# Patient Record
Sex: Male | Born: 1975
Health system: Southern US, Community
[De-identification: ages and names within clinical notes are randomized; demographics above are authoritative.]

## PROBLEM LIST (undated history)

## (undated) DIAGNOSIS — F1010 Alcohol abuse, uncomplicated: Secondary | ICD-10-CM

## (undated) DIAGNOSIS — K859 Acute pancreatitis without necrosis or infection, unspecified: Secondary | ICD-10-CM

---

## 2016-11-13 ENCOUNTER — Emergency Department (HOSPITAL_COMMUNITY): Payer: No Typology Code available for payment source

## 2016-11-13 ENCOUNTER — Emergency Department (HOSPITAL_COMMUNITY)
Admission: EM | Admit: 2016-11-13 | Discharge: 2016-11-13 | Disposition: A | Discharge status: Home / Self Care | Payer: No Typology Code available for payment source | Attending: Emergency Medicine | Admitting: Emergency Medicine

## 2016-11-13 ENCOUNTER — Encounter (HOSPITAL_COMMUNITY): Payer: Self-pay | Admitting: Emergency Medicine

## 2016-11-13 DIAGNOSIS — Q631 Lobulated, fused and horseshoe kidney: Secondary | ICD-10-CM | POA: Insufficient documentation

## 2016-11-13 DIAGNOSIS — F17210 Nicotine dependence, cigarettes, uncomplicated: Secondary | ICD-10-CM | POA: Diagnosis not present

## 2016-11-13 DIAGNOSIS — R1013 Epigastric pain: Secondary | ICD-10-CM

## 2016-11-13 DIAGNOSIS — R1010 Upper abdominal pain, unspecified: Secondary | ICD-10-CM | POA: Diagnosis present

## 2016-11-13 LAB — URINALYSIS, ROUTINE W REFLEX MICROSCOPIC
BILIRUBIN URINE: NEGATIVE
GLUCOSE, UA: NEGATIVE mg/dL
Hgb urine dipstick: NEGATIVE
KETONES UR: 80 mg/dL — AB
LEUKOCYTES UA: NEGATIVE
Nitrite: NEGATIVE
Protein, ur: NEGATIVE mg/dL
Specific Gravity, Urine: 1.03 (ref 1.005–1.030)
pH: 5 (ref 5.0–8.0)

## 2016-11-13 LAB — COMPREHENSIVE METABOLIC PANEL
ALT: 22 U/L (ref 17–63)
AST: 25 U/L (ref 15–41)
Albumin: 4.9 g/dL (ref 3.5–5.0)
Alkaline Phosphatase: 74 U/L (ref 38–126)
Anion gap: 11 (ref 5–15)
BILIRUBIN TOTAL: 1.5 mg/dL — AB (ref 0.3–1.2)
BUN: 18 mg/dL (ref 6–20)
CHLORIDE: 102 mmol/L (ref 101–111)
CO2: 25 mmol/L (ref 22–32)
CREATININE: 0.94 mg/dL (ref 0.61–1.24)
Calcium: 9.4 mg/dL (ref 8.9–10.3)
GFR calc Af Amer: >60 mL/min (ref >60)
Glucose, Bld: 114 mg/dL — ABNORMAL HIGH (ref 65–99)
Potassium: 3.4 mmol/L — ABNORMAL LOW (ref 3.5–5.1)
Sodium: 138 mmol/L (ref 135–145)
TOTAL PROTEIN: 7.8 g/dL (ref 6.5–8.1)

## 2016-11-13 LAB — CBC
HCT: 44.3 % (ref 39.0–52.0)
Hemoglobin: 15.6 g/dL (ref 13.0–17.0)
MCH: 32.7 pg (ref 26.0–34.0)
MCHC: 35.2 g/dL (ref 30.0–36.0)
MCV: 92.9 fL (ref 78.0–100.0)
PLATELETS: 188 10*3/uL (ref 150–400)
RBC: 4.77 MIL/uL (ref 4.22–5.81)
RDW: 12.1 % (ref 11.5–15.5)
WBC: 5.4 10*3/uL (ref 4.0–10.5)

## 2016-11-13 LAB — LIPASE, BLOOD: Lipase: 17 U/L (ref 11–51)

## 2016-11-13 MED ORDER — IOPAMIDOL (ISOVUE-300) INJECTION 61%
100.0000 mL | Freq: Once | INTRAVENOUS | Status: AC | PRN
  Administered 2016-11-13: 100 mL via INTRAVENOUS

## 2016-11-13 MED ORDER — SUCRALFATE 1 G PO TABS: 1.0000 g | ORAL_TABLET | Freq: Three times a day (TID) | ORAL | 0 refills | Status: DC

## 2016-11-13 MED ORDER — ONDANSETRON HCL 4 MG/2ML IJ SOLN
4.0000 mg | Freq: Once | INTRAMUSCULAR | Status: AC
  Administered 2016-11-13: 4 mg via INTRAVENOUS
  Filled 2016-11-13: qty 2

## 2016-11-13 MED ORDER — OXYCODONE-ACETAMINOPHEN 5-325 MG PO TABS: 1.0000 | ORAL_TABLET | Freq: Four times a day (QID) | ORAL | 0 refills | Status: DC | PRN

## 2016-11-13 MED ORDER — OMEPRAZOLE 20 MG PO CPDR: 20.0000 mg | DELAYED_RELEASE_CAPSULE | Freq: Every day | ORAL | 0 refills | Status: DC

## 2016-11-13 MED ORDER — HYDROMORPHONE HCL 1 MG/ML IJ SOLN
1.0000 mg | Freq: Once | INTRAMUSCULAR | Status: AC
  Administered 2016-11-13: 1 mg via INTRAVENOUS
  Filled 2016-11-13: qty 1

## 2016-11-13 MED ORDER — IOPAMIDOL (ISOVUE-300) INJECTION 61%
INTRAVENOUS | Status: AC
  Filled 2016-11-13: qty 100

## 2016-11-13 MED ORDER — SODIUM CHLORIDE 0.9 % IV BOLUS (SEPSIS)
1000.0000 mL | Freq: Once | INTRAVENOUS | Status: AC
  Administered 2016-11-13: 1000 mL via INTRAVENOUS

## 2016-11-13 MED ORDER — FAMOTIDINE 20 MG PO TABS: 20.0000 mg | ORAL_TABLET | Freq: Two times a day (BID) | ORAL | 0 refills | Status: DC

## 2016-11-13 NOTE — ED Provider Notes (Signed)
[facility]-EMERGENCY DEPT Provider Note   CSN: [id number] Arrival date & time: 11/13/16  1443     History   Chief Complaint Chief Complaint  Patient presents with  . Abdominal Pain  . Nausea    HPI Stephen Bender is a 41 y.o. male.  Patient with no past surgical history, 20+ year alcohol history, drinks approximately a pint of liquor per day -- presents with onset of upper abdominal pain starting last night. Patient has had associated nausea but no vomiting. No fevers, chest pain, shortness of breath. No diarrhea or urinary symptoms including hematuria. Pain radiates into his mid back at times. Patient has not eaten today because the pain has been severe and constant. He had difficulty sleeping because of the pain last night. His never had symptoms like this before except for a short lived mild episode earlier in the week. Onset of symptoms acute. Course is gradually worsening. Nothing makes symptoms better or worse. Patient takes NSAIDs on occasion, not everyday.       History reviewed. No pertinent past medical history.  There are no active problems to display for this patient.   History reviewed. No pertinent surgical history.     Home Medications    Prior to Admission medications   Not on File    Family History No family history on file.  Social History Social History  Substance Use Topics  . Smoking status: Current Every Day Smoker    Packs/day: 1.00    Types: Cigarettes  . Smokeless tobacco: Not on file  . Alcohol use Yes     Allergies   Patient has no known allergies.   Review of Systems Review of Systems  Constitutional: Negative for fever.  HENT: Negative for rhinorrhea and sore throat.   Eyes: Negative for redness.  Respiratory: Negative for cough.   Cardiovascular: Negative for chest pain.  Gastrointestinal: Positive for abdominal pain and nausea. Negative for diarrhea and vomiting.  Genitourinary: Negative for dysuria.  Musculoskeletal: Positive  for back pain. Negative for myalgias.  Skin: Negative for rash.  Neurological: Negative for headaches.     Physical Exam Updated Vital Signs BP (!) 151/98 (BP Location: Right Arm)   Pulse 60   Temp 97.9 F (36.6 C) (Oral)   Resp 20   Wt 79.4 kg   SpO2 100%   Physical Exam  Constitutional: He appears well-developed and well-nourished. He appears distressed (Uncomfortable appearing).  HENT:  Head: Normocephalic and atraumatic.  Eyes: Conjunctivae are normal. Right eye exhibits no discharge. Left eye exhibits no discharge.  Neck: Normal range of motion. Neck supple.  Cardiovascular: Normal rate, regular rhythm and normal heart sounds.   Pulmonary/Chest: Effort normal and breath sounds normal. No respiratory distress. He has no wheezes. He has no rales.  Abdominal: Soft. He exhibits no mass. There is tenderness. There is guarding. There is no rebound, no tenderness at McBurney's point and negative Murphy's sign.    Neurological: He is alert.  Skin: Skin is warm and dry.  Psychiatric: He has a normal mood and affect.  Nursing note and vitals reviewed.    ED Treatments / Results  Labs (all labs ordered are listed, but only abnormal results are displayed) Labs Reviewed  COMPREHENSIVE METABOLIC PANEL - Abnormal; Notable for the following:       Result Value   Potassium 3.4 (*)    Glucose, Bld 114 (*)    Total Bilirubin 1.5 (*)    All other components within normal limits  URINALYSIS,  ROUTINE W REFLEX MICROSCOPIC - Abnormal; Notable for the following:    Ketones, ur 80 (*)    All other components within normal limits  LIPASE, BLOOD  CBC    EKG  EKG Interpretation None       Radiology Ct Abdomen Pelvis W Contrast  Result Date: 11/13/2016 CLINICAL DATA:  Abdominal pain with nausea for 24 hours. EXAM: CT ABDOMEN AND PELVIS WITH CONTRAST TECHNIQUE: Multidetector CT imaging of the abdomen and pelvis was performed using the standard protocol following bolus  administration of intravenous contrast. CONTRAST:  ISOVUE-300 IOPAMIDOL (ISOVUE-300) INJECTION 61% COMPARISON:  None. FINDINGS: Lower chest: No acute abnormality. Hepatobiliary: No focal liver abnormality is seen. No gallstones, gallbladder wall thickening, or biliary dilatation. Pancreas: Unremarkable. No pancreatic ductal dilatation or surrounding inflammatory changes. Spleen: Normal in size without focal abnormality. Adrenals/Urinary Tract: There is a horseshoe kidney. No renal masses or stones. No hydronephrosis. Ureters are normal in course and in caliber. Bladder is unremarkable. Stomach/Bowel: Stomach is within normal limits. Appendix appears normal. No evidence of bowel wall thickening, distention, or inflammatory changes. Vascular/Lymphatic: No significant vascular findings are present. No enlarged abdominal or pelvic lymph nodes. Reproductive: Prostate is unremarkable. Other: No abdominal wall hernia or abnormality. No abdominopelvic ascites. Musculoskeletal: No acute or significant osseous findings. IMPRESSION: 1. No acute findings within the abdomen or pelvis. 2. Horseshoe kidney. Electronically Signed   By: Amie Portland M.D.   On: 11/13/2016 19:05    Procedures Procedures (including critical care time)  Medications Ordered in ED Medications  iopamidol (ISOVUE-300) 61 % injection (not administered)  sodium chloride 0.9 % bolus 1,000 mL (1,000 mLs Intravenous New Bag/Given 11/13/16 1824)  HYDROmorphone (DILAUDID) injection 1 mg (1 mg Intravenous Given 11/13/16 1824)  ondansetron (ZOFRAN) injection 4 mg (4 mg Intravenous Given 11/13/16 1823)  iopamidol (ISOVUE-300) 61 % injection 100 mL (100 mLs Intravenous Contrast Given 11/13/16 1845)     Initial Impression / Assessment and Plan / ED Course  I have reviewed the triage vital signs and the nursing notes.  Pertinent labs & imaging results that were available during my care of the patient were reviewed by me and considered in my medical  decision making (see chart for details).     Patient seen and examined. Labs reviewed and are reassuring however patient appears uncomfortable. No previous imaging. CT ordered given due to exam. Medications ordered.   Vital signs reviewed and are as follows: BP (!) 151/98 (BP Location: Right Arm)   Pulse 60   Temp 97.9 F (36.6 C) (Oral)   Resp 20   Wt 79.4 kg   SpO2 100%   8:29 PM Patient's symptoms resolved with treatment. Informed of neg CT result. PO challenged without pain or vomiting. Will discharge to home with #5 Percocet, omeprazole, Pepcid, Carafate. GI and PCP referrals given. Patient informed of incidental horseshoe kidney finding on CT scan. He has no urinary problems or history of such.   The patient was urged to return to the Emergency Department immediately with worsening of current symptoms, worsening abdominal pain, persistent vomiting, blood noted in stools, fever, or any other concerns. The patient verbalized understanding.    Final Clinical Impressions(s) / ED Diagnoses   Final diagnoses:  Epigastric pain  Horseshoe kidney   Patient with Epigastric abdominal pain. Vitals are stable, no fever. Labs reassuring. Imaging incidental horseshoe kidney however no other concerning findings. Ordered due to pain out of proportion to exam. No signs of dehydration, patient is tolerating PO's.  Lungs are clear and no signs suggestive of PNA. Low concern for appendicitis, cholecystitis, pancreatitis, ruptured viscus, UTI, kidney stone, aortic dissection, aortic aneurysm or other emergent abdominal etiology. Supportive therapy indicated with return if symptoms worsen.     New Prescriptions Discharge Medication List as of 11/13/2016  8:22 PM    START taking these medications   Details  famotidine (PEPCID) 20 MG tablet Take 1 tablet (20 mg total) by mouth 2 (two) times daily., Starting Fri 11/13/2016, Print    omeprazole (PRILOSEC) 20 MG capsule Take 1 capsule (20 mg total) by  mouth daily., Starting Fri 11/13/2016, Print    oxyCODONE-acetaminophen (PERCOCET/ROXICET) 5-325 MG tablet Take 1-2 tablets by mouth every 6 (six) hours as needed for severe pain., Starting Fri 11/13/2016, Print    sucralfate (CARAFATE) 1 g tablet Take 1 tablet (1 g total) by mouth 4 (four) times daily -  with meals and at bedtime., Starting Fri 11/13/2016, Print         Easten Maceachern, PA-C 11/13/16 2039    Derwood Kaplan, MD 11/15/16 1233

## 2016-11-13 NOTE — Discharge Instructions (Signed)
Please read and follow all provided instructions.  Your diagnoses today include:  1. Epigastric pain     Tests performed today include:  Blood counts and electrolytes  Blood tests to check liver and kidney function  Blood tests to check pancreas function  Urine test to look for infection and pregnancy (in women)  CT scan - shows horseshoe kidney, no other problems  Vital signs. See below for your results today.   Medications prescribed:   Omeprazole (Prilosec) - stomach acid reducer  This medication can be found over-the-counter   Pepcid (famotidine) - antihistamine  You can find this medication over-the-counter.   DO NOT exceed:    Pepcid every 12 hours   Carafate - for stomach upset and to protect your stomach   Percocet (oxycodone/acetaminophen) - narcotic pain medication  DO NOT drive or perform any activities that require you to be awake and alert because this medicine can make you drowsy. BE VERY CAREFUL not to take multiple medicines containing Tylenol (also called acetaminophen). Doing so can lead to an overdose which can damage your liver and cause liver failure and possibly death. Take any prescribed medications only as directed.  Home care instructions:   Follow any educational materials contained in this packet.  Follow-up instructions: Please follow-up with your primary care provider in the next 7 days for further evaluation of your symptoms.    Return instructions:  SEEK IMMEDIATE MEDICAL ATTENTION IF:  The pain does not go away or becomes severe   A temperature above 101F develops   Repeated vomiting occurs (multiple episodes)   The pain becomes localized to portions of the abdomen. The right side could possibly be appendicitis. In an adult, the left lower portion of the abdomen could be colitis or diverticulitis.   Blood is being passed in stools or vomit (bright red or black tarry stools)   You develop chest pain, difficulty  breathing, dizziness or fainting, or become confused, poorly responsive, or inconsolable (young children)  If you have any other emergent concerns regarding your health  Additional Information: Abdominal (belly) pain can be caused by many things. Your caregiver performed an examination and possibly ordered blood/urine tests and imaging (CT scan, x-rays, ultrasound). Many cases can be observed and treated at home after initial evaluation in the emergency department. Even though you are being discharged home, abdominal pain can be unpredictable. Therefore, you need a repeated exam if your pain does not resolve, returns, or worsens. Most patients with abdominal pain don't have to be admitted to the hospital or have surgery, but serious problems like appendicitis and gallbladder attacks can start out as nonspecific pain. Many abdominal conditions cannot be diagnosed in one visit, so follow-up evaluations are very important.  Your vital signs today were: BP 139/88 (BP Location: Left Arm)    Pulse 71    Temp 97.8 F (36.6 C) (Oral)    Resp 16    Wt 79.4 kg    SpO2 100%  If your blood pressure (bp) was elevated above 135/85 this visit, please have this repeated by your doctor within one month. --------------

## 2016-11-13 NOTE — ED Triage Notes (Signed)
Pt complaint of generalized abdominal pain with associated nausea post eating chicken last night; believes he was constipated but pain as worsened since.

## 2018-09-19 ENCOUNTER — Other Ambulatory Visit: Payer: Self-pay

## 2018-09-19 ENCOUNTER — Encounter (HOSPITAL_BASED_OUTPATIENT_CLINIC_OR_DEPARTMENT_OTHER): Payer: Self-pay

## 2018-09-19 ENCOUNTER — Emergency Department (HOSPITAL_BASED_OUTPATIENT_CLINIC_OR_DEPARTMENT_OTHER)
Admission: EM | Admit: 2018-09-19 | Discharge: 2018-09-19 | Disposition: A | Discharge status: Home / Self Care | Payer: No Typology Code available for payment source | Attending: Emergency Medicine | Admitting: Emergency Medicine

## 2018-09-19 ENCOUNTER — Emergency Department (HOSPITAL_BASED_OUTPATIENT_CLINIC_OR_DEPARTMENT_OTHER): Payer: No Typology Code available for payment source

## 2018-09-19 DIAGNOSIS — Y929 Unspecified place or not applicable: Secondary | ICD-10-CM | POA: Insufficient documentation

## 2018-09-19 DIAGNOSIS — S92515A Nondisplaced fracture of proximal phalanx of left lesser toe(s), initial encounter for closed fracture: Secondary | ICD-10-CM | POA: Insufficient documentation

## 2018-09-19 DIAGNOSIS — Y9389 Activity, other specified: Secondary | ICD-10-CM | POA: Insufficient documentation

## 2018-09-19 DIAGNOSIS — S92532A Displaced fracture of distal phalanx of left lesser toe(s), initial encounter for closed fracture: Secondary | ICD-10-CM | POA: Insufficient documentation

## 2018-09-19 DIAGNOSIS — W228XXA Striking against or struck by other objects, initial encounter: Secondary | ICD-10-CM | POA: Insufficient documentation

## 2018-09-19 DIAGNOSIS — Z79899 Other long term (current) drug therapy: Secondary | ICD-10-CM | POA: Insufficient documentation

## 2018-09-19 DIAGNOSIS — F17210 Nicotine dependence, cigarettes, uncomplicated: Secondary | ICD-10-CM | POA: Insufficient documentation

## 2018-09-19 DIAGNOSIS — Y998 Other external cause status: Secondary | ICD-10-CM | POA: Insufficient documentation

## 2018-09-19 MED ORDER — IBUPROFEN 800 MG PO TABS: 800.0000 mg | ORAL_TABLET | Freq: Three times a day (TID) | ORAL | 0 refills | Status: DC | PRN

## 2018-09-19 MED FILL — IBUPROFEN 800 MG TAB: 800 | 10 days supply | Qty: 30 | Fill #0

## 2018-09-19 NOTE — ED Notes (Signed)
ED Provider at bedside. 

## 2018-09-19 NOTE — Discharge Instructions (Addendum)
It was my pleasure taking care of you today!   Ibuprofen as needed for pain. Ice and elevate for additional pain relief.   Follow up with your doctor if your symptoms persist.   Return to ER for new or worsening symptoms, any additional concerns.

## 2018-09-19 NOTE — ED Provider Notes (Signed)
[facility] EMERGENCY DEPARTMENT Provider Note   CSN: [id number] Arrival date & time: 09/19/18  1117     History   Chief Complaint Chief Complaint  Patient presents with  . Foot Injury    HPI Stephen Bender is a 43 y.o. male.  The history is provided by the patient and medical records. No language interpreter was used.  Foot Injury   Stephen Bender is a 43 y.o. male who presents to the Emergency Department complaining of left little toe pain.  He actually stumped his toe on furniture about a week ago.  It was sore, but he was able to continue working and did not think much about it.  He was at work today when a piece of lumber fell on his foot and gave him an acute worsening of his pain.  He denies any numbness or tingling.  No weakness.  Able to ambulate on the injury.  History reviewed. No pertinent past medical history.  There are no active problems to display for this patient.   History reviewed. No pertinent surgical history.      Home Medications    Prior to Admission medications   Medication Sig Start Date End Date Taking? Authorizing Provider  bismuth subsalicylate (PEPTO BISMOL) 262 MG chewable tablet Chew 524 mg by mouth as needed for indigestion.    [provider]  famotidine (PEPCID) 20 MG tablet Take 1 tablet (20 mg total) by mouth 2 (two) times daily. 11/13/16   Renne Crigler, PA-C  ibuprofen (ADVIL,MOTRIN) 800 MG tablet Take 1 tablet (800 mg total) by mouth every 8 (eight) hours as needed. 09/19/18   Selah Klang, Chase Picket, PA-C  omeprazole (PRILOSEC) 20 MG capsule Take 1 capsule (20 mg total) by mouth daily. 11/13/16   Renne Crigler, PA-C  oxyCODONE-acetaminophen (PERCOCET/ROXICET) 5-325 MG tablet Take 1-2 tablets by mouth every 6 (six) hours as needed for severe pain. 11/13/16   Renne Crigler, PA-C  sucralfate (CARAFATE) 1 g tablet Take 1 tablet (1 g total) by mouth 4 (four) times daily -  with meals and at bedtime. 11/13/16   Renne Crigler, PA-C      Family History No family history on file.  Social History Social History   Tobacco Use  . Smoking status: Current Every Day Smoker    Packs/day: 1.00    Types: Cigarettes  . Smokeless tobacco: Never Used  Substance Use Topics  . Alcohol use: Yes    Comment: occ  . Drug use: No     Allergies   Patient has no known allergies.   Review of Systems Review of Systems  Musculoskeletal: Positive for arthralgias and myalgias.  Skin: Negative for color change and wound.  Neurological: Negative for weakness and numbness.     Physical Exam Updated Vital Signs BP 129/87 (BP Location: Right Arm)   Pulse 89   Temp 97.7 F (36.5 C) (Oral)   Resp 18   Ht 6\' 4"  (1.93 m)   Wt 77.3 kg   SpO2 97%   BMI 20.73 kg/m   Physical Exam Vitals signs and nursing note reviewed.  Constitutional:      General: He is not in acute distress.    Appearance: He is well-developed.  HENT:     Head: Normocephalic and atraumatic.  Neck:     Musculoskeletal: Neck supple.  Cardiovascular:     Rate and Rhythm: Normal rate and regular rhythm.     Heart sounds: Normal heart sounds. No murmur.  Pulmonary:  Effort: Pulmonary effort is normal. No respiratory distress.     Breath sounds: Normal breath sounds. No wheezing or rales.  Musculoskeletal: Normal range of motion.     Comments: Tenderness and swelling to the left fifth toe.  No break of the skin.  No pain to the malleoli or forefoot.  Full range of motion.  2+ DP.  Sensation intact.  Skin:    General: Skin is warm and dry.  Neurological:     Mental Status: He is alert.      ED Treatments / Results  Labs (all labs ordered are listed, but only abnormal results are displayed) Labs Reviewed - No data to display  EKG None  Radiology Dg Foot Complete Left  Result Date: 09/19/2018 CLINICAL DATA:  Left foot pain centered about the little toe since a load of wood fell on the patient's foot 1 week ago. Initial encounter. EXAM: LEFT  FOOT - COMPLETE 3+ VIEW COMPARISON:  None. FINDINGS: The patient has a nondisplaced fracture of the neck of the proximal phalanx of the little toe. There is also a nondisplaced fracture of the distal phalanx of the little toe. Neither fracture disrupts an articular surface. No other acute bony or joint abnormality is identified. No evidence of arthropathy. IMPRESSION: Nondisplaced fractures of the neck of the proximal phalanx and the distal phalanx of the left little toe. Electronically Signed   By: Drusilla Kanner M.D.   On: 09/19/2018 12:26    Procedures Procedures (including critical care time)  Medications Ordered in ED Medications - No data to display   Initial Impression / Assessment and Plan / ED Course  I have reviewed the triage vital signs and the nursing notes.  Pertinent labs & imaging results that were available during my care of the patient were reviewed by me and considered in my medical decision making (see chart for details).     Stephen Bender is a 43 y.o. male who presents to ED for pain to the left fifth toe after dropping lumber on the foot earlier today.  Neurovascularly intact on exam. Does have tenderness and swelling to the toe. No open wound. X-ray shows nondisplaced fractures of the neck of the proximal phalanx and the distal phalanx.  Given postop shoe.  Rx for ibuprofen as needed.  PCP follow-up if symptoms are not improving.  Home care instructions discussed.  All questions answered.   Final Clinical Impressions(s) / ED Diagnoses   Final diagnoses:  Closed displaced fracture of distal phalanx of lesser toe of left foot, initial encounter  Closed nondisplaced fracture of proximal phalanx of lesser toe of left foot, initial encounter    ED Discharge Orders         Ordered    ibuprofen (ADVIL,MOTRIN) 800 MG tablet  Every 8 hours PRN     09/19/18 1256           Arin Peral, Chase Picket, PA-C 09/19/18 1305    Alvira Monday, MD 09/23/18 2224

## 2018-09-19 NOTE — ED Triage Notes (Signed)
Pt states he injured left 5th toe 1 week ago-load of wood fell on left foot at work today-bruising noted to left 2-3 toes-no break in skin-NAD-steady gait

## 2018-09-19 NOTE — ED Notes (Signed)
Pt verbalized understanding of discharge instructions and denies any further questions at this time.   

## 2018-10-13 ENCOUNTER — Emergency Department (HOSPITAL_COMMUNITY)
Admission: EM | Admit: 2018-10-13 | Discharge: 2018-10-13 | Disposition: A | Discharge status: Home / Self Care | Payer: Self-pay | Attending: Emergency Medicine | Admitting: Emergency Medicine

## 2018-10-13 ENCOUNTER — Other Ambulatory Visit: Payer: Self-pay

## 2018-10-13 ENCOUNTER — Encounter (HOSPITAL_COMMUNITY): Payer: Self-pay | Admitting: *Deleted

## 2018-10-13 DIAGNOSIS — F17210 Nicotine dependence, cigarettes, uncomplicated: Secondary | ICD-10-CM | POA: Insufficient documentation

## 2018-10-13 DIAGNOSIS — W01198A Fall on same level from slipping, tripping and stumbling with subsequent striking against other object, initial encounter: Secondary | ICD-10-CM | POA: Insufficient documentation

## 2018-10-13 DIAGNOSIS — Z79899 Other long term (current) drug therapy: Secondary | ICD-10-CM | POA: Insufficient documentation

## 2018-10-13 DIAGNOSIS — Y92015 Private garage of single-family (private) house as the place of occurrence of the external cause: Secondary | ICD-10-CM | POA: Insufficient documentation

## 2018-10-13 DIAGNOSIS — Y9389 Activity, other specified: Secondary | ICD-10-CM | POA: Insufficient documentation

## 2018-10-13 DIAGNOSIS — S0181XA Laceration without foreign body of other part of head, initial encounter: Secondary | ICD-10-CM | POA: Insufficient documentation

## 2018-10-13 DIAGNOSIS — Z23 Encounter for immunization: Secondary | ICD-10-CM | POA: Insufficient documentation

## 2018-10-13 DIAGNOSIS — Y999 Unspecified external cause status: Secondary | ICD-10-CM | POA: Insufficient documentation

## 2018-10-13 MED ORDER — LIDOCAINE-EPINEPHRINE (PF) 2 %-1:200000 IJ SOLN
20.0000 mL | Freq: Once | INTRAMUSCULAR | Status: AC
  Administered 2018-10-13: 20 mL via INTRADERMAL
  Filled 2018-10-13: qty 20

## 2018-10-13 MED ORDER — LIDOCAINE-EPINEPHRINE-TETRACAINE (LET) SOLUTION
3.0000 mL | Freq: Once | NASAL | Status: AC
  Administered 2018-10-13: 22:00:00 3 mL via TOPICAL
  Filled 2018-10-13: qty 3

## 2018-10-13 MED ORDER — IBUPROFEN 800 MG PO TABS: 800.0000 mg | ORAL_TABLET | Freq: Three times a day (TID) | ORAL | 0 refills | Status: DC | PRN

## 2018-10-13 MED ORDER — OXYCODONE-ACETAMINOPHEN 5-325 MG PO TABS
1.0000 | ORAL_TABLET | Freq: Once | ORAL | Status: AC
  Administered 2018-10-13: 1 via ORAL
  Filled 2018-10-13: qty 1

## 2018-10-13 MED ORDER — TETANUS-DIPHTH-ACELL PERTUSSIS 5-2.5-18.5 LF-MCG/0.5 IM SUSP
0.5000 mL | Freq: Once | INTRAMUSCULAR | Status: AC
  Administered 2018-10-13: 0.5 mL via INTRAMUSCULAR
  Filled 2018-10-13: qty 0.5

## 2018-10-13 NOTE — Discharge Instructions (Signed)
Take ibuprofen as needed for pain.  Monitor wound for any signs of infection.  Have your sutures remove in 5 days at the urgent care center.

## 2018-10-13 NOTE — ED Notes (Signed)
Bed: WTR8 Expected date:  Expected time:  Means of arrival:  Comments: 
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
## 2018-10-20 ENCOUNTER — Ambulatory Visit
Admission: EM | Admit: 2018-10-20 | Discharge: 2018-10-20 | Disposition: A | Discharge status: Home / Self Care | Payer: No Typology Code available for payment source

## 2018-10-20 DIAGNOSIS — Z4802 Encounter for removal of sutures: Secondary | ICD-10-CM

## 2018-10-20 NOTE — ED Notes (Signed)
After further assessment patient concerned, and this RN agrees, that uncertain if area is fully healed.  Asked APP to look at patient prior to suture removal.

## 2018-10-20 NOTE — ED Triage Notes (Signed)
Pt presents to [facility] for assessment of removal of 5 sutures from his chin, placed 7 days prior.

## 2018-10-20 NOTE — ED Notes (Signed)
Patient able to ambulate independently  

## 2018-10-20 NOTE — ED Notes (Signed)
After quick assessment by APP patient cleared for suture removal by this RN.  5 Sutures removed, return precautions reviewed.

## 2020-12-30 ENCOUNTER — Emergency Department (HOSPITAL_COMMUNITY): Payer: Self-pay

## 2020-12-30 ENCOUNTER — Encounter (HOSPITAL_COMMUNITY): Payer: Self-pay

## 2020-12-30 ENCOUNTER — Observation Stay (HOSPITAL_COMMUNITY)
Admission: EM | Admit: 2020-12-30 | Discharge: 2020-12-31 | Disposition: A | Discharge status: Home / Self Care | Payer: No Typology Code available for payment source | Attending: Internal Medicine | Admitting: Internal Medicine

## 2020-12-30 ENCOUNTER — Other Ambulatory Visit: Payer: Self-pay

## 2020-12-30 DIAGNOSIS — K859 Acute pancreatitis without necrosis or infection, unspecified: Principal | ICD-10-CM | POA: Insufficient documentation

## 2020-12-30 DIAGNOSIS — R1084 Generalized abdominal pain: Secondary | ICD-10-CM

## 2020-12-30 DIAGNOSIS — Z720 Tobacco use: Secondary | ICD-10-CM

## 2020-12-30 DIAGNOSIS — D696 Thrombocytopenia, unspecified: Secondary | ICD-10-CM | POA: Insufficient documentation

## 2020-12-30 DIAGNOSIS — Z20822 Contact with and (suspected) exposure to covid-19: Secondary | ICD-10-CM | POA: Insufficient documentation

## 2020-12-30 DIAGNOSIS — F1010 Alcohol abuse, uncomplicated: Secondary | ICD-10-CM

## 2020-12-30 DIAGNOSIS — F17210 Nicotine dependence, cigarettes, uncomplicated: Secondary | ICD-10-CM | POA: Insufficient documentation

## 2020-12-30 LAB — COMPREHENSIVE METABOLIC PANEL
ALT: 117 U/L — ABNORMAL HIGH (ref 0–44)
AST: 79 U/L — ABNORMAL HIGH (ref 15–41)
Albumin: 3.4 g/dL — ABNORMAL LOW (ref 3.5–5.0)
Alkaline Phosphatase: 78 U/L (ref 38–126)
Anion gap: 12 (ref 5–15)
BUN: 12 mg/dL (ref 6–20)
CO2: 25 mmol/L (ref 22–32)
Calcium: 9 mg/dL (ref 8.9–10.3)
Chloride: 97 mmol/L — ABNORMAL LOW (ref 98–111)
Creatinine, Ser: 0.93 mg/dL (ref 0.61–1.24)
GFR, Estimated: >60 mL/min (ref >60)
Glucose, Bld: 99 mg/dL (ref 70–99)
Potassium: 3.9 mmol/L (ref 3.5–5.1)
Sodium: 134 mmol/L — ABNORMAL LOW (ref 135–145)
Total Bilirubin: 1.8 mg/dL — ABNORMAL HIGH (ref 0.3–1.2)
Total Protein: 6.4 g/dL — ABNORMAL LOW (ref 6.5–8.1)

## 2020-12-30 LAB — CBC
HCT: 44.3 % (ref 39.0–52.0)
Hemoglobin: 16 g/dL (ref 13.0–17.0)
MCH: 35.9 pg — ABNORMAL HIGH (ref 26.0–34.0)
MCHC: 36.1 g/dL — ABNORMAL HIGH (ref 30.0–36.0)
MCV: 99.3 fL (ref 80.0–100.0)
Platelets: 127 10*3/uL — ABNORMAL LOW (ref 150–400)
RBC: 4.46 MIL/uL (ref 4.22–5.81)
RDW: 11.7 % (ref 11.5–15.5)
WBC: 6.3 10*3/uL (ref 4.0–10.5)
nRBC: 0 % (ref 0.0–0.2)

## 2020-12-30 LAB — URINALYSIS, ROUTINE W REFLEX MICROSCOPIC
Bilirubin Urine: NEGATIVE
Glucose, UA: NEGATIVE mg/dL
Hgb urine dipstick: NEGATIVE
Ketones, ur: 5 mg/dL — AB
Leukocytes,Ua: NEGATIVE
Nitrite: NEGATIVE
Protein, ur: NEGATIVE mg/dL
Specific Gravity, Urine: 1.017 (ref 1.005–1.030)
pH: 6 (ref 5.0–8.0)

## 2020-12-30 LAB — HEPATIC FUNCTION PANEL
ALT: 103 U/L — ABNORMAL HIGH (ref 0–44)
AST: 69 U/L — ABNORMAL HIGH (ref 15–41)
Albumin: 3.3 g/dL — ABNORMAL LOW (ref 3.5–5.0)
Alkaline Phosphatase: 75 U/L (ref 38–126)
Bilirubin, Direct: 0.7 mg/dL — ABNORMAL HIGH (ref 0.0–0.2)
Indirect Bilirubin: 1.5 mg/dL — ABNORMAL HIGH (ref 0.3–0.9)
Total Bilirubin: 2.2 mg/dL — ABNORMAL HIGH (ref 0.3–1.2)
Total Protein: 6.1 g/dL — ABNORMAL LOW (ref 6.5–8.1)

## 2020-12-30 LAB — LIPASE, BLOOD
Lipase: 73 U/L — ABNORMAL HIGH (ref 11–51)
Lipase: 57 U/L — ABNORMAL HIGH (ref 11–51)

## 2020-12-30 MED ORDER — DIPHENHYDRAMINE HCL 25 MG PO CAPS
25.0000 mg | ORAL_CAPSULE | Freq: Once | ORAL | Status: AC
  Administered 2020-12-30: 25 mg via ORAL
  Filled 2020-12-30: qty 1

## 2020-12-30 MED ORDER — SODIUM CHLORIDE 0.9 % IV BOLUS
1000.0000 mL | Freq: Once | INTRAVENOUS | Status: AC
  Administered 2020-12-30: 1000 mL via INTRAVENOUS

## 2020-12-30 MED ORDER — HYDROMORPHONE HCL 1 MG/ML IJ SOLN
0.5000 mg | Freq: Once | INTRAMUSCULAR | Status: AC
  Administered 2020-12-30: 0.5 mg via INTRAVENOUS
  Filled 2020-12-30: qty 1

## 2020-12-30 MED ORDER — GADOBUTROL 1 MMOL/ML IV SOLN
8.0000 mL | Freq: Once | INTRAVENOUS | Status: AC | PRN
  Administered 2020-12-30: 8 mL via INTRAVENOUS

## 2020-12-30 MED ORDER — HYDROMORPHONE HCL 1 MG/ML IJ SOLN
0.5000 mg | INTRAMUSCULAR | Status: AC | PRN
  Administered 2020-12-30 (×2): 0.5 mg via INTRAVENOUS
  Filled 2020-12-30 (×2): qty 1

## 2020-12-30 MED ORDER — HYDROMORPHONE HCL 1 MG/ML IJ SOLN
1.0000 mg | Freq: Once | INTRAMUSCULAR | Status: AC
  Administered 2020-12-30: 1 mg via INTRAVENOUS
  Filled 2020-12-30: qty 1

## 2020-12-30 MED ORDER — ONDANSETRON HCL 4 MG/2ML IJ SOLN
4.0000 mg | Freq: Once | INTRAMUSCULAR | Status: AC
  Administered 2020-12-30: 4 mg via INTRAVENOUS
  Filled 2020-12-30: qty 2

## 2020-12-30 NOTE — ED Provider Notes (Signed)
  Physical Exam  BP (!) 147/91 (BP Location: Right Arm)   Pulse (!) 58   Temp 98.4 F (36.9 C) (Oral)   Resp 18   Ht 6\' 4"  (1.93 m)   Wt 79.4 kg   SpO2 99%   BMI 21.30 kg/m   Physical Exam Vitals and nursing note reviewed.  Constitutional:      Appearance: Normal appearance.  HENT:     Head: Normocephalic and atraumatic.  Cardiovascular:     Rate and Rhythm: Normal rate.  Pulmonary:     Effort: Pulmonary effort is normal.  Musculoskeletal:     Cervical back: Normal range of motion.  Neurological:     Mental Status: He is alert.     ED Course/Procedures   Clinical Course as of 12/30/20 1537  Mon Dec 30, 2020  2092 45 year old male with abdominal pain onset yesterday morning, similar pain previously but never seen in the ER/worked up. Admits to alcohol use, no history of pancreatitis. No prior abdominal surgeries. Generalized tenderness on exam.  Initial labs obtained 8 hours prior to exam, CMP with elevated LFTs to 79/117, bili 1.8, lipast elevated at 73. Repeat labs with lipase improved to 49m LFTs with slight decrease to 69/103, bili increased slightly to 2.2.  CBC with normal WBC. Patient was given dilaudid and IV fluids, 52m with mild intrahepatic and extrahepatic biliary dilatation.  Plan is for MRCP to further evaluate for distal CBD or obstructive process. Discussed results and plan of care with patient.  Pain is improved after second dose of Dilaudid.  Additional dose ordered if needed when his pain medication wears off with additional IV fluids. [LM]  1440 Care signed out to oncoming provider pending MRCP. [LM]    Clinical Course User Index [LM] [place], PA-C    Procedures  MDM   Pt signed out to me by L. Jeannie Fend, PA-C.  Please see previous notes for further history.  In brief, patient presenting for evaluation of abdominal pain.  He is a heavy alcohol drinker.  Concern for pancreatitis versus gallstone obstruction..  Lipase and LFTs are up.   Ultrasound did not show any clear stone, however unable to definitively rule out gallstone in the duct.  Recommends MRCP for further eval  MRCP without sign of gallstone, however does show acute pancreatitis.  On reevaluation, patient reports pain and nausea improved, however he does not feel he can go home safely at this time. As such, will call for admission.   Discussed with Dr. Eulah Pont from [facility] hospitalist service, patient to be admitted     Loney Loh, PA-C 12/30/20 2304    [place], 2305, DO 12/31/20 2350

## 2020-12-30 NOTE — ED Notes (Signed)
Patient transported to MRI 

## 2020-12-30 NOTE — ED Triage Notes (Signed)
Patient reports abdominal pain x 24 hours, reports it has become severe in the last 3 hours, denies N/V/D

## 2020-12-30 NOTE — ED Provider Notes (Signed)
[facility] EMERGENCY DEPARTMENT Provider Note   CSN: [id number] Arrival date & time: 12/30/20  0028     History Chief Complaint  Patient presents with  . Abdominal Pain    Stephen Bender is a 45 y.o. male.  45 year old male with history of alcohol use presents with complaint of generalized abdominal pain onset yesterday morning, worsening, sharp pain that radiates to his back, worse with palpation, unable to find position of comfort. Denies nausea, vomiting, fevers, changes in bowel or bladder habits. History of similar pain previously but never seen for it. No prior abdominal surgeries.         History reviewed. No pertinent past medical history.  There are no problems to display for this patient.   History reviewed. No pertinent surgical history.     History reviewed. No pertinent family history.  Social History   Tobacco Use  . Smoking status: Current Every Day Smoker    Packs/day: 1.00    Types: Cigarettes  . Smokeless tobacco: Never Used  Vaping Use  . Vaping Use: Never used  Substance Use Topics  . Alcohol use: Yes    Comment: occ  . Drug use: No    Home Medications Prior to Admission medications   Medication Sig Start Date End Date Taking? Authorizing Provider  famotidine (PEPCID) 20 MG tablet Take 1 tablet (20 mg total) by mouth 2 (two) times daily. Patient not taking: No sig reported 11/13/16   Renne Crigler, PA-C  ibuprofen (ADVIL,MOTRIN) 800 MG tablet Take 1 tablet (800 mg total) by mouth every 8 (eight) hours as needed. Patient not taking: No sig reported 10/13/18   Fayrene Helper, PA-C  omeprazole (PRILOSEC) 20 MG capsule Take 1 capsule (20 mg total) by mouth daily. Patient not taking: No sig reported 11/13/16   Renne Crigler, PA-C  oxyCODONE-acetaminophen (PERCOCET/ROXICET) 5-325 MG tablet Take 1-2 tablets by mouth every 6 (six) hours as needed for severe pain. Patient not taking: No sig reported 11/13/16   Renne Crigler, PA-C   sucralfate (CARAFATE) 1 g tablet Take 1 tablet (1 g total) by mouth 4 (four) times daily -  with meals and at bedtime. Patient not taking: No sig reported 11/13/16   Renne Crigler, PA-C    Allergies    Patient has no known allergies.  Review of Systems   Review of Systems  Constitutional: Negative for diaphoresis and fever.  Respiratory: Negative for shortness of breath.   Cardiovascular: Negative for chest pain.  Gastrointestinal: Positive for abdominal pain. Negative for constipation, diarrhea, nausea and vomiting.  Genitourinary: Negative for difficulty urinating and dysuria.  Musculoskeletal: Positive for back pain.  Skin: Negative for rash and wound.  Allergic/Immunologic: Negative for immunocompromised state.  Neurological: Negative for weakness.  Psychiatric/Behavioral: Negative for confusion. The patient is not nervous/anxious.   All other systems reviewed and are negative.   Physical Exam Updated Vital Signs BP 140/88   Pulse (!) 57   Temp 98.4 F (36.9 C) (Oral)   Resp 14   Ht 6\' 4"  (1.93 m)   Wt 79.4 kg   SpO2 96%   BMI 21.30 kg/m   Physical Exam Vitals and nursing note reviewed.  Constitutional:      General: He is not in acute distress.    Appearance: He is well-developed. He is not diaphoretic.  HENT:     Head: Normocephalic and atraumatic.  Cardiovascular:     Rate and Rhythm: Normal rate and regular rhythm.  Heart sounds: Normal heart sounds.  Pulmonary:     Effort: Pulmonary effort is normal.     Breath sounds: Normal breath sounds.  Abdominal:     Palpations: Abdomen is soft.     Tenderness: There is generalized abdominal tenderness.  Skin:    General: Skin is warm and dry.     Coloration: Skin is not pale.     Findings: No rash.  Neurological:     Mental Status: He is alert and oriented to person, place, and time.  Psychiatric:        Behavior: Behavior normal.     ED Results / Procedures / Treatments   Labs (all labs ordered are  listed, but only abnormal results are displayed) Labs Reviewed  LIPASE, BLOOD - Abnormal; Notable for the following components:      Result Value   Lipase 73 (*)    All other components within normal limits  COMPREHENSIVE METABOLIC PANEL - Abnormal; Notable for the following components:   Sodium 134 (*)    Chloride 97 (*)    Total Protein 6.4 (*)    Albumin 3.4 (*)    AST 79 (*)    ALT 117 (*)    Total Bilirubin 1.8 (*)    All other components within normal limits  CBC - Abnormal; Notable for the following components:   MCH 35.9 (*)    MCHC 36.1 (*)    Platelets 127 (*)    All other components within normal limits  URINALYSIS, ROUTINE W REFLEX MICROSCOPIC - Abnormal; Notable for the following components:   Ketones, ur 5 (*)    All other components within normal limits  LIPASE, BLOOD - Abnormal; Notable for the following components:   Lipase 57 (*)    All other components within normal limits  HEPATIC FUNCTION PANEL - Abnormal; Notable for the following components:   Total Protein 6.1 (*)    Albumin 3.3 (*)    AST 69 (*)    ALT 103 (*)    Total Bilirubin 2.2 (*)    Bilirubin, Direct 0.7 (*)    Indirect Bilirubin 1.5 (*)    All other components within normal limits    EKG EKG Interpretation  Date/Time:  Monday Dec 30 2020 00:35:38 EDT Ventricular Rate:  87 PR Interval:  136 QRS Duration: 82 QT Interval:  364 QTC Calculation: 438 R Axis:   96 Text Interpretation: Normal sinus rhythm Rightward axis Pulmonary disease pattern No old tracing to compare Confirmed by Pricilla Loveless [phone]) on 12/30/2020 8:07:09 AM   Radiology US Abdomen Limited  Result Date: 12/30/2020 CLINICAL DATA:  Epigastric abdominal pain starting yesterday EXAM: ULTRASOUND ABDOMEN LIMITED RIGHT UPPER QUADRANT COMPARISON:  CT abdomen 11/13/2016 FINDINGS: Gallbladder: No gallstones or wall thickening visualized. No sonographic Morrie Daywalt sign noted by sonographer. Common bile duct: Diameter: 0.6 cm, mildly  dilated, without directly visualized choledocholithiasis. Liver: No focal lesion identified. Mild intrahepatic biliary dilatation. Portal vein is patent on color Doppler imaging with normal direction of blood flow towards the liver. Other: None. IMPRESSION: 1. Mild intrahepatic and extrahepatic biliary dilatation. No directly visualized choledocholithiasis, although given the elevated bilirubin levels, the possibility of a distal CBD obstructive process cannot be excluded. Further workup possibilities might include MRCP (if the patient is capable of suspending respiration and holding still), or CT abdomen for further characterization. Electronically Signed   By: Gaylyn Rong M.D.   On: 12/30/2020 13:05    Procedures Procedures   Medications Ordered in ED  Medications  HYDROmorphone (DILAUDID) injection 0.5 mg (has no administration in time range)  sodium chloride 0.9 % bolus 1,000 mL (0 mLs Intravenous Stopped 12/30/20 1123)  ondansetron (ZOFRAN) injection 4 mg (4 mg Intravenous Given 12/30/20 0843)  HYDROmorphone (DILAUDID) injection 1 mg (1 mg Intravenous Given 12/30/20 0844)  HYDROmorphone (DILAUDID) injection 0.5 mg (0.5 mg Intravenous Given 12/30/20 1243)  sodium chloride 0.9 % bolus 1,000 mL (1,000 mLs Intravenous New Bag/Given 12/30/20 1339)    ED Course  I have reviewed the triage vital signs and the nursing notes.  Pertinent labs & imaging results that were available during my care of the patient were reviewed by me and considered in my medical decision making (see chart for details).  Clinical Course as of 12/30/20 1441  Mon Dec 30, 2020  707 45 year old male with abdominal pain onset yesterday morning, similar pain previously but never seen in the ER/worked up. Admits to alcohol use, no history of pancreatitis. No prior abdominal surgeries. Generalized tenderness on exam.  Initial labs obtained 8 hours prior to exam, CMP with elevated LFTs to 79/117, bili 1.8, lipast elevated at  73. Repeat labs with lipase improved to 56m LFTs with slight decrease to 69/103, bili increased slightly to 2.2.  CBC with normal WBC. Patient was given dilaudid and IV fluids, [place] with mild intrahepatic and extrahepatic biliary dilatation.  Plan is for MRCP to further evaluate for distal CBD or obstructive process. Discussed results and plan of care with patient.  Pain is improved after second dose of Dilaudid.  Additional dose ordered if needed when his pain medication wears off with additional IV fluids. [LM]  1440 Care signed out to oncoming provider pending MRCP. [LM]    Clinical Course User Index [LM] Alden Hipp   MDM Rules/Calculators/A&P                          Final Clinical Impression(s) / ED Diagnoses Final diagnoses:  None    Rx / DC Orders ED Discharge Orders    None       Jeannie Fend, PA-C 12/30/20 1441    Pricilla Loveless, MD 12/31/20 [phone]

## 2020-12-30 NOTE — ED Notes (Signed)
Pt tolerated fluid/po challenge well. Pt is currently not complaining of abdominal pain or N/V.

## 2020-12-30 NOTE — Progress Notes (Signed)
Exam order is incorrect, needs to be changed from w/ contrast only to W/WO if contrast is needed. [place] sent to ordering PA about order change. Cannot scan until order is changed.

## 2020-12-31 DIAGNOSIS — Z720 Tobacco use: Secondary | ICD-10-CM

## 2020-12-31 DIAGNOSIS — F1010 Alcohol abuse, uncomplicated: Secondary | ICD-10-CM

## 2020-12-31 DIAGNOSIS — R1084 Generalized abdominal pain: Secondary | ICD-10-CM

## 2020-12-31 DIAGNOSIS — D696 Thrombocytopenia, unspecified: Secondary | ICD-10-CM

## 2020-12-31 DIAGNOSIS — K852 Alcohol induced acute pancreatitis without necrosis or infection: Secondary | ICD-10-CM

## 2020-12-31 LAB — COMPREHENSIVE METABOLIC PANEL
ALT: 70 U/L — ABNORMAL HIGH (ref 0–44)
AST: 47 U/L — ABNORMAL HIGH (ref 15–41)
Albumin: 2.8 g/dL — ABNORMAL LOW (ref 3.5–5.0)
Alkaline Phosphatase: 69 U/L (ref 38–126)
Anion gap: 7 (ref 5–15)
BUN: 9 mg/dL (ref 6–20)
CO2: 30 mmol/L (ref 22–32)
Calcium: 8.3 mg/dL — ABNORMAL LOW (ref 8.9–10.3)
Chloride: 97 mmol/L — ABNORMAL LOW (ref 98–111)
Creatinine, Ser: 1.01 mg/dL (ref 0.61–1.24)
GFR, Estimated: >60 mL/min (ref >60)
Glucose, Bld: 109 mg/dL — ABNORMAL HIGH (ref 70–99)
Potassium: 3.8 mmol/L (ref 3.5–5.1)
Sodium: 134 mmol/L — ABNORMAL LOW (ref 135–145)
Total Bilirubin: 1.5 mg/dL — ABNORMAL HIGH (ref 0.3–1.2)
Total Protein: 5.5 g/dL — ABNORMAL LOW (ref 6.5–8.1)

## 2020-12-31 LAB — PHOSPHORUS: Phosphorus: 3.2 mg/dL (ref 2.5–4.6)

## 2020-12-31 LAB — CBC
HCT: 38.9 % — ABNORMAL LOW (ref 39.0–52.0)
Hemoglobin: 13.1 g/dL (ref 13.0–17.0)
MCH: 34.8 pg — ABNORMAL HIGH (ref 26.0–34.0)
MCHC: 33.7 g/dL (ref 30.0–36.0)
MCV: 103.5 fL — ABNORMAL HIGH (ref 80.0–100.0)
Platelets: 111 10*3/uL — ABNORMAL LOW (ref 150–400)
RBC: 3.76 MIL/uL — ABNORMAL LOW (ref 4.22–5.81)
RDW: 11.7 % (ref 11.5–15.5)
WBC: 3.1 10*3/uL — ABNORMAL LOW (ref 4.0–10.5)
nRBC: 0 % (ref 0.0–0.2)

## 2020-12-31 LAB — MAGNESIUM: Magnesium: 2 mg/dL (ref 1.7–2.4)

## 2020-12-31 LAB — SARS CORONAVIRUS 2 (TAT 6-24 HRS): SARS Coronavirus 2: NEGATIVE

## 2020-12-31 LAB — HIV ANTIBODY (ROUTINE TESTING W REFLEX): HIV Screen 4th Generation wRfx: NONREACTIVE

## 2020-12-31 LAB — LIPASE, BLOOD: Lipase: 49 U/L (ref 11–51)

## 2020-12-31 MED ORDER — ENSURE ENLIVE PO LIQD
237.0000 mL | Freq: Two times a day (BID) | ORAL | Status: DC
  Administered 2020-12-31: 237 mL via ORAL

## 2020-12-31 MED ORDER — MORPHINE SULFATE (PF) 2 MG/ML IV SOLN: 1.0000 mg | INTRAVENOUS | Status: DC | PRN

## 2020-12-31 MED ORDER — THIAMINE HCL 100 MG PO TABS
100.0000 mg | ORAL_TABLET | Freq: Every day | ORAL | Status: DC
  Administered 2020-12-31: 100 mg via ORAL
  Filled 2020-12-31: qty 1

## 2020-12-31 MED ORDER — LORAZEPAM 1 MG PO TABS
1.0000 mg | ORAL_TABLET | ORAL | Status: DC | PRN
Start: 2020-12-31 — End: 2020-12-31

## 2020-12-31 MED ORDER — THIAMINE HCL 100 MG/ML IJ SOLN
100.0000 mg | Freq: Every day | INTRAMUSCULAR | Status: DC
  Filled 2020-12-31: qty 2

## 2020-12-31 MED ORDER — FOLIC ACID 1 MG PO TABS
1.0000 mg | ORAL_TABLET | Freq: Every day | ORAL | Status: DC
  Administered 2020-12-31: 1 mg via ORAL
  Filled 2020-12-31: qty 1

## 2020-12-31 MED ORDER — LACTATED RINGERS IV SOLN: INTRAVENOUS | Status: DC

## 2020-12-31 MED ORDER — ENOXAPARIN SODIUM 40 MG/0.4ML IJ SOSY: 40.0000 mg | PREFILLED_SYRINGE | INTRAMUSCULAR | Status: DC

## 2020-12-31 MED ORDER — NICOTINE 21 MG/24HR TD PT24
21.0000 mg | MEDICATED_PATCH | Freq: Every day | TRANSDERMAL | Status: DC
  Administered 2020-12-31: 21 mg via TRANSDERMAL
  Filled 2020-12-31: qty 1

## 2020-12-31 MED ORDER — LORAZEPAM 2 MG/ML IJ SOLN: 1.0000 mg | INTRAMUSCULAR | Status: DC | PRN

## 2020-12-31 MED ORDER — PANTOPRAZOLE SODIUM 40 MG PO TBEC: 40.0000 mg | DELAYED_RELEASE_TABLET | Freq: Every day | ORAL | 1 refills | Status: DC

## 2020-12-31 MED ORDER — ACETAMINOPHEN 325 MG PO TABS
650.0000 mg | ORAL_TABLET | Freq: Four times a day (QID) | ORAL | Status: DC | PRN
  Administered 2020-12-31: 650 mg via ORAL
  Filled 2020-12-31: qty 2

## 2020-12-31 MED ORDER — ADULT MULTIVITAMIN W/MINERALS CH
1.0000 | ORAL_TABLET | Freq: Every day | ORAL | Status: DC
  Administered 2020-12-31: 1 via ORAL
  Filled 2020-12-31: qty 1

## 2020-12-31 NOTE — Discharge Summary (Signed)
Physician Discharge Summary  Stephen GrayerJoshua Bender RUE:[id number] DOB: 04/23/1976 DOA: 12/30/2020  PCP: Stephen Bender, No Pcp Per (Inactive)  Admit date: 12/30/2020 Discharge date: 12/31/2020  Admitted From: Home Disposition: Home  Recommendations for Outpatient Follow-up:  1. Follow up with PCP in 1-2 weeks 2. Please obtain BMP/CBC in one week  Home Health: Equipment/Devices:  Discharge Condition: Stable CODE STATUS: Full code Diet recommendation: Clear liquid diet for now, advance to low-fat in the next 1 to 2 days  Brief/Interim Summary: [age] year old male with history of alcohol abuse, GERD, admitted to the hospital with 1 day history of abdominal pain rating to his back.  He was found to have mildly elevated lipase.  MRCP showed mild acute interstitial pancreatitis.  No walled off fluid collections or pancreatic ductal dilatation.  No biliary ductal dilatation.  No cholelithiasis or choledocholithiasis.  Stephen Bender was treated with IV fluids and pain management.  He was admitted for further treatments.  Overnight, his hospital course was uneventful.  He did not have any signs of alcohol withdrawal.  Diet was advanced and is tolerating solid food without any significant vomiting.  He does have some abdominal discomfort after eating.  He has been advised to continue on clear liquids for the next day or 2 prior to advancing back to low-fat diet.  He was strongly advised to abstain from any further alcohol use, he reports understanding.  Stephen Bender feels ready for discharge home at this time.  Discharge Diagnoses:  Principal Problem:   Acute pancreatitis Active Problems:   Alcohol abuse   Thrombocytopenia (HCC)   Tobacco use    Discharge Instructions  Discharge Instructions    Diet - low sodium heart healthy   Complete by: As directed    Increase activity slowly   Complete by: As directed      Allergies as of 12/31/2020   No Known Allergies     Medication List    STOP taking these medications    famotidine 20 MG tablet Commonly known as: PEPCID   ibuprofen 800 MG tablet Commonly known as: ADVIL   omeprazole 20 MG capsule Commonly known as: PRILOSEC   oxyCODONE-acetaminophen 5-325 MG tablet Commonly known as: PERCOCET/ROXICET   sucralfate 1 g tablet Commonly known as: Carafate     TAKE these medications   pantoprazole 40 MG tablet Commonly known as: Protonix Take 1 tablet (40 mg total) by mouth daily.       No Known Allergies  Consultations:     Procedures/Studies: US Abdomen Limited  Result Date: 12/30/2020 CLINICAL DATA:  Epigastric abdominal pain starting yesterday EXAM: ULTRASOUND ABDOMEN LIMITED RIGHT UPPER QUADRANT COMPARISON:  CT abdomen 11/13/2016 FINDINGS: Gallbladder: No gallstones or wall thickening visualized. No sonographic Murphy sign noted by sonographer. Common bile duct: Diameter: 0.6 cm, mildly dilated, without directly visualized choledocholithiasis. Liver: No focal lesion identified. Mild intrahepatic biliary dilatation. Portal vein is patent on color Doppler imaging with normal direction of blood flow towards the liver. Other: None. IMPRESSION: 1. Mild intrahepatic and extrahepatic biliary dilatation. No directly visualized choledocholithiasis, although given the elevated bilirubin levels, the possibility of a distal CBD obstructive process cannot be excluded. Further workup possibilities might include MRCP (if the Stephen Bender is capable of suspending respiration and holding still), or CT abdomen for further characterization. Electronically Signed   By: Gaylyn RongWalter  Liebkemann M.D.   On: 12/30/2020 13:05   MR ABDOMEN MRCP W WO CONTAST  Result Date: 12/30/2020 CLINICAL DATA:  Right upper quadrant abdominal pain. EXAM: MRI ABDOMEN WITHOUT AND WITH  CONTRAST (INCLUDING MRCP) TECHNIQUE: Multiplanar multisequence MR imaging of the abdomen was performed both before and after the administration of intravenous contrast. Heavily T2-weighted images of the biliary and  pancreatic ducts were obtained, and three-dimensional MRCP images were rendered by post processing. CONTRAST:  9mL GADAVIST GADOBUTROL 1 MMOL/ML IV SOLN COMPARISON:  Same day ultrasound and CT delete that November 13, 2016 FINDINGS: Lower chest: Bibasilar atelectasis. Normal size heart. No significant pericardial effusion/thickening. Hepatobiliary: No hepatic steatosis. No suspicious hepatic lesions. Gallbladder is unremarkable without stones or wall thickening. No biliary ductal dilation. Pancreas: Mild decrease intrinsic T1 signal in the pancreatic head/uncinate process with adjacent inflammatory stranding. No pancreatic ductal dilation. No areas of non pancreatic enhancement. No walled off fluid collections. Spleen:  Within normal limits Adrenals/Urinary Tract: Horseshoe kidney. No hydronephrosis. No suspicious renal masses. Stomach/Bowel: Visualized portions within the abdomen are unremarkable. Vascular/Lymphatic: No pathologically enlarged lymph nodes identified. No abdominal aortic aneurysm demonstrated. Other:  None. Musculoskeletal: No suspicious bone lesions identified. IMPRESSION: 1. Mild acute interstitial pancreatitis. No walled off fluid collections or pancreatic ductal dilation. 2. No biliary ductal dilation, cholelithiasis or choledocholithiasis. Electronically Signed   By: Maudry Mayhew MD   On: 12/30/2020 20:54       Subjective: Feeling better today.  Tolerating diet.  No vomiting.  Feels that abdominal pain is reasonably controlled.  Has not required any pain medicine in over 16 hours  Discharge Exam: Vitals:   12/31/20 0200 12/31/20 0220 12/31/20 0224 12/31/20 0630  BP: 134/79  (!) 137/94 (!) 135/91  Pulse: 74 78 70 89  Resp: 12  17 18   Temp: 98.1 F (36.7 C) 97.8 F (36.6 C)  98.7 F (37.1 C)  TempSrc: Oral Oral  Oral  SpO2: 95% 98%  97%  Weight:      Height:        General: Pt is alert, awake, not in acute distress Cardiovascular: RRR, S1/S2 +, no rubs, no  gallops Respiratory: CTA bilaterally, no wheezing, no rhonchi Abdominal: Soft, NT, ND, bowel sounds + Extremities: no edema, no cyanosis    The results of significant diagnostics from this hospitalization (including imaging, microbiology, ancillary and laboratory) are listed below for reference.     Microbiology: Recent Results (from the past 240 hour(s))  SARS CORONAVIRUS 2 (TAT 6-24 HRS) Nasopharyngeal Nasopharyngeal Swab     Status: None   Collection Time: 12/30/20 11:45 PM   Specimen: Nasopharyngeal Swab  Result Value Ref Range Status   SARS Coronavirus 2 NEGATIVE NEGATIVE Final    Comment: (NOTE) SARS-CoV-2 target nucleic acids are NOT DETECTED.  The SARS-CoV-2 RNA is generally detectable in upper and lower respiratory specimens during the acute phase of infection. Negative results do not preclude SARS-CoV-2 infection, do not rule out co-infections with other pathogens, and should not be used as the sole basis for treatment or other Stephen Bender management decisions. Negative results must be combined with clinical observations, Stephen Bender history, and epidemiological information. The expected result is Negative.  Fact Sheet for Patients: 01/01/21  Fact Sheet for Healthcare Providers: HairSlick.no  This test is not yet approved or cleared by the [place] FDA and  has been authorized for detection and/or diagnosis of SARS-CoV-2 by FDA under an Emergency Use Authorization (EUA). This EUA will remain  in effect (meaning this test can be used) for the duration of the COVID-19 declaration under Se ction 564(b)(1) of the Act, 21 U.S.C. section 360bbb-3(b)(1), unless the authorization is terminated or revoked sooner.  Performed at [facility]  [facility], [address], [place], [place] [address]      Labs: BNP (last 3 results) No results for input(s): BNP in the last 8760 hours. Basic Metabolic Panel: Recent Labs   Lab 12/30/20 0053 12/31/20 0421  NA 134* 134*  K 3.9 3.8  CL 97* 97*  CO2 25 30  GLUCOSE 99 109*  BUN 12 9  CREATININE 0.93 1.01  CALCIUM 9.0 8.3*  MG  --  2.0  PHOS  --  3.2   Liver Function Tests: Recent Labs  Lab 12/30/20 0053 12/30/20 0846 12/31/20 0421  AST 79* 69* 47*  ALT 117* 103* 70*  ALKPHOS 78 75 69  BILITOT 1.8* 2.2* 1.5*  PROT 6.4* 6.1* 5.5*  ALBUMIN 3.4* 3.3* 2.8*   Recent Labs  Lab 12/30/20 0053 12/30/20 0846 12/31/20 0421  LIPASE 73* 57* 49   No results for input(s): AMMONIA in the last 168 hours. CBC: Recent Labs  Lab 12/30/20 0053 12/31/20 0421  WBC 6.3 3.1*  HGB 16.0 13.1  HCT 44.3 38.9*  MCV 99.3 103.5*  PLT 127* 111*   Cardiac Enzymes: No results for input(s): CKTOTAL, CKMB, CKMBINDEX, TROPONINI in the last 168 hours. BNP: Invalid input(s): POCBNP CBG: No results for input(s): GLUCAP in the last 168 hours. D-Dimer No results for input(s): DDIMER in the last 72 hours. Hgb A1c No results for input(s): HGBA1C in the last 72 hours. Lipid Profile No results for input(s): CHOL, HDL, LDLCALC, TRIG, CHOLHDL, LDLDIRECT in the last 72 hours. Thyroid function studies No results for input(s): TSH, T4TOTAL, T3FREE, THYROIDAB in the last 72 hours.  Invalid input(s): FREET3 Anemia work up No results for input(s): VITAMINB12, FOLATE, FERRITIN, TIBC, IRON, RETICCTPCT in the last 72 hours. Urinalysis    Component Value Date/Time   COLORURINE YELLOW 12/30/2020 1150   APPEARANCEUR CLEAR 12/30/2020 1150   LABSPEC 1.017 12/30/2020 1150   PHURINE 6.0 12/30/2020 1150   GLUCOSEU NEGATIVE 12/30/2020 1150   HGBUR NEGATIVE 12/30/2020 1150   BILIRUBINUR NEGATIVE 12/30/2020 1150   KETONESUR 5 (A) 12/30/2020 1150   PROTEINUR NEGATIVE 12/30/2020 1150   NITRITE NEGATIVE 12/30/2020 1150   LEUKOCYTESUR NEGATIVE 12/30/2020 1150   Sepsis Labs Invalid input(s): PROCALCITONIN,  WBC,  LACTICIDVEN Microbiology Recent Results (from the past 240 hour(s))   SARS CORONAVIRUS 2 (TAT 6-24 HRS) Nasopharyngeal Nasopharyngeal Swab     Status: None   Collection Time: 12/30/20 11:45 PM   Specimen: Nasopharyngeal Swab  Result Value Ref Range Status   SARS Coronavirus 2 NEGATIVE NEGATIVE Final    Comment: (NOTE) SARS-CoV-2 target nucleic acids are NOT DETECTED.  The SARS-CoV-2 RNA is generally detectable in upper and lower respiratory specimens during the acute phase of infection. Negative results do not preclude SARS-CoV-2 infection, do not rule out co-infections with other pathogens, and should not be used as the sole basis for treatment or other Stephen Bender management decisions. Negative results must be combined with clinical observations, Stephen Bender history, and epidemiological information. The expected result is Negative.  Fact Sheet for Patients: HairSlick.no  Fact Sheet for Healthcare Providers: quierodirigir.com  This test is not yet approved or cleared by the [place] FDA and  has been authorized for detection and/or diagnosis of SARS-CoV-2 by FDA under an Emergency Use Authorization (EUA). This EUA will remain  in effect (meaning this test can be used) for the duration of the COVID-19 declaration under Se ction 564(b)(1) of the Act, 21 U.S.C. section 360bbb-3(b)(1), unless the authorization is terminated or revoked sooner.  Performed at [facility], [address] [place], [place] [address]      Time coordinating discharge:  SIGNED:   Erick Blinks, MD  [facility] 12/31/2020, 12:19 PM   If 7PM-7AM, please contact night-coverage [email or web address]

## 2020-12-31 NOTE — TOC CAGE-AID Note (Signed)
Transition of Care [facility]) - CAGE-AID Screening   Patient Details  Name: Stephen Bender MRN: [id number] Date of Birth: 12-09-1975  [facility]) CM/SW Contact:    Ralene Bathe, LCSWA Phone Number: 12/31/2020, 12:30 PM   Clinical Narrative: CSW received a consult for substance use counseling/ education.  The patient was contacted prior to d/c and the CAGE aid was administered.  The patient was alert and oriented x4 during the assessment.    The patient reported that he only drinks on the weekend and may only have "two beers". He reports that his mother does criticize when he drinks because of how he feels the next day.  The patient declined substance abuse resources as he reports that he can stop or has supports in place if assistance is  needed.  Patient reports planning to discontinue alcohol use as he is now having issues with his pancreas.    CSW signing off.  Please consult should any other d/c needs arise.    CAGE-AID Screening:    Have You Ever Felt You Ought to Cut Down on Your Drinking or Drug Use?: Yes Have People Annoyed You By Critizing Your Drinking Or Drug Use?: No Have You Felt Bad Or Guilty About Your Drinking Or Drug Use?: No Have You Ever Had a Drink or Used Drugs First Thing In The Morning to Steady Your Nerves or to Get Rid of a Hangover?: Yes CAGE-AID Score: 2  Substance Abuse Education Offered: Yes

## 2020-12-31 NOTE — Plan of Care (Signed)
  Problem: Clinical Measurements: Goal: Ability to maintain clinical measurements within normal limits will improve Outcome: Progressing Goal: Will remain free from infection Outcome: Progressing   

## 2020-12-31 NOTE — Progress Notes (Addendum)
Initial Nutrition Assessment  DOCUMENTATION CODES:   Not applicable  INTERVENTION:   -Magic cup TID with meals, each supplement provides 290 kcal and 9 grams of protein -MVI with minerals daily -Ensure Enlive po BID, each supplement provides 350 kcal and 20 grams of protein  NUTRITION DIAGNOSIS:   Increased nutrient needs related to acute illness (pancreatitis) as evidenced by estimated needs.  GOAL:   Patient will meet greater than or equal to 90% of their needs  MONITOR:   PO intake,Supplement acceptance,Labs,Weight trends,Skin,I & O's  REASON FOR ASSESSMENT:   Malnutrition Screening Tool    ASSESSMENT:   Stephen Bender is a 45 y.o. male with medical history significant of GERD, alcohol abuse, tobacco use presented to the ED with a 1 day history of abdominal pain radiating to his back.  Pt admitted with acute alcoholic pancreatitis.   Reviewed I/O's: +2 L x 24 hours  Spoke with pt at bedside, who was very eager to go home today.   He reports his intake was normal prior to Sunday, due to abdominal pain. He shares that he was NPO for Most of the day yesterday due to various tests and now he is very hungry- he shares with this RD that he has consumed 100% of breakfast as well as crackers and peanut butter today.   Per pt, he almost always skips breakfast, but consumes lunch and dinner (usually fast food or [employer]). 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   Pt shares his UBW is around 175# and he has lost about 15# in the past month. He reports that his appetite has been decreased secondary to stress, including a recent divorce. However, per wt hx, wt has been stable over the past 2 years.   Discussed importance of good meal and supplement intake to promote healing. Provided pt with a chocolate Ensure as well as peanut butter and crackers per his request.   Medications reviewed and  include folic acid, thiamine, MVI, and lactated ringers infusion @ 200 ml/hr.   Labs reviewed: Na: 134.  NUTRITION - FOCUSED PHYSICAL EXAM:  Flowsheet Row Most Recent Value  Orbital Region No depletion  Upper Arm Region Moderate depletion  Thoracic and Lumbar Region No depletion  Buccal Region No depletion  Temple Region Mild depletion  Clavicle Bone Region Moderate depletion  Clavicle and Acromion Bone Region Mild depletion  Scapular Bone Region Mild depletion  Dorsal Hand No depletion  Patellar Region No depletion  Anterior Thigh Region No depletion  Posterior Calf Region No depletion  Edema (RD Assessment) None  Hair Reviewed  Eyes Reviewed  Mouth Reviewed  Skin Reviewed  Nails Reviewed       Diet Order:   Diet Order            Diet Heart Room service appropriate? Yes; Fluid consistency: Thin  Diet effective now                 EDUCATION NEEDS:   Education needs have been addressed  Skin:  Skin Assessment: Reviewed RN Assessment  Last BM:  12/29/20  Height:   Ht Readings from Last 1 Encounters:  12/30/20 6\' 4"  (1.93 m)    Weight:   Wt Readings from Last 1 Encounters:  12/30/20 79.4 kg    Ideal Body Weight:  91.8 kg  BMI:  Body mass index is 21.3 kg/m.  Estimated Nutritional Needs:   Kcal:  2400-2600  Protein:  120-135 grams  Fluid:  > 2 L    Levada Schilling, RD, LDN, CDCES Registered Dietitian II Certified Diabetes Care and Education Specialist Please refer to [facility] for RD and/or RD on-call/weekend/after hours pager

## 2020-12-31 NOTE — H&P (Signed)
History and Physical    Stephen Bender UMP:[id number] DOB: 12-04-1975 DOA: 12/30/2020  PCP: Patient, No Pcp Per (Inactive) Patient coming from: Home  Chief Complaint: Abdominal pain  HPI: Stephen Bender is a 45 y.o. male with medical history significant of GERD, alcohol abuse, tobacco use presented to the ED with a 1 day history of abdominal pain radiating to his back.  In the ED, not febrile or tachycardic.  Labs showing WBC 6.3, hemoglobin 16.0, platelet count 127K.  Sodium 134, potassium 3.9, chloride 97, bicarb 25, BUN 12, creatinine 0.9, glucose 99.  AST 79, ALT 117.  T bili 1.8.  Alk phos within normal range.  Lipase 73 >57.  Repeat hepatic function panel showing AST 69, ALT 103, T bili 2.2, and normal alk phos.  UA without signs of infection.  Screening COVID test pending.  Right upper quadrant ultrasound revealed mild intrahepatic and extrahepatic biliary dilatation but no visualized gallstones.  MRCP showing mild acute interstitial pancreatitis.  No walled off fluid collections or pancreatic ductal dilatation.  No biliary ductal dilatation, cholelithiasis, or choledocholithiasis. Patient was given Benadryl, Dilaudid, Zofran, and 2 L normal saline boluses.  Patient reports 2-day history of severe, constant, throbbing epigastric pain radiating to his back.  No associated nausea or vomiting.  No fevers.  Reports drinking alcohol several days a week and drinks several bottles of beer each time.  States he was previously drinking pints of liquor at a time.  Denies history of gallstones or pancreatitis.  He is not vaccinated against COVID.  Denies cough, shortness of breath, or URI symptoms.  Denies chest pain.  Reports occasionally taking over-the-counter medications for GERD.  Denies any other medical problems.  He does not take any other medications.  Review of Systems:  All systems reviewed and apart from history of presenting illness, are negative.  History reviewed. No pertinent past medical  history.  History reviewed. No pertinent surgical history.   reports that he has been smoking cigarettes. He has been smoking about 1.00 pack per day. He has never used smokeless tobacco. He reports current alcohol use. He reports that he does not use drugs.  No Known Allergies  History reviewed. No pertinent family history.  Prior to Admission medications   Medication Sig Start Date End Date Taking? Authorizing Provider  famotidine (PEPCID) 20 MG tablet Take 1 tablet (20 mg total) by mouth 2 (two) times daily. Patient not taking: No sig reported 11/13/16   Carlisle Cater, PA-C  ibuprofen (ADVIL,MOTRIN) 800 MG tablet Take 1 tablet (800 mg total) by mouth every 8 (eight) hours as needed. Patient not taking: No sig reported 10/13/18   Domenic Moras, PA-C  omeprazole (PRILOSEC) 20 MG capsule Take 1 capsule (20 mg total) by mouth daily. Patient not taking: No sig reported 11/13/16   Carlisle Cater, PA-C  oxyCODONE-acetaminophen (PERCOCET/ROXICET) 5-325 MG tablet Take 1-2 tablets by mouth every 6 (six) hours as needed for severe pain. Patient not taking: No sig reported 11/13/16   Carlisle Cater, PA-C  sucralfate (CARAFATE) 1 g tablet Take 1 tablet (1 g total) by mouth 4 (four) times daily -  with meals and at bedtime. Patient not taking: No sig reported 11/13/16   Aldrich, Lloyd, PA-C    Physical Exam: Vitals:   12/30/20 2330 12/31/20 0000 12/31/20 0030 12/31/20 0100  BP: (!) 146/83 126/74 127/87 125/89  Pulse: 75 78 73 68  Resp: 15 12 11 11   Temp:      TempSrc:  SpO2: 96% 96% 95% 95%  Weight:      Height:        Physical Exam Constitutional:      General: He is not in acute distress. HENT:     Head: Normocephalic and atraumatic.  Eyes:     Extraocular Movements: Extraocular movements intact.     Conjunctiva/sclera: Conjunctivae normal.  Cardiovascular:     Rate and Rhythm: Normal rate and regular rhythm.     Pulses: Normal pulses.  Pulmonary:     Effort: Pulmonary effort is  normal. No respiratory distress.     Breath sounds: Normal breath sounds. No wheezing or rales.  Abdominal:     General: Bowel sounds are normal. There is no distension.     Palpations: Abdomen is soft.     Tenderness: There is abdominal tenderness. There is guarding. There is no rebound.     Comments: Epigastrium tender to palpation with guarding  Musculoskeletal:        General: No swelling or tenderness.     Cervical back: Normal range of motion and neck supple.  Skin:    General: Skin is warm and dry.  Neurological:     General: No focal deficit present.     Mental Status: He is alert and oriented to person, place, and time.     Labs on Admission: I have personally reviewed following labs and imaging studies  CBC: Recent Labs  Lab 12/30/20 0053  WBC 6.3  HGB 16.0  HCT 44.3  MCV 99.3  PLT 536*   Basic Metabolic Panel: Recent Labs  Lab 12/30/20 0053  NA 134*  K 3.9  CL 97*  CO2 25  GLUCOSE 99  BUN 12  CREATININE 0.93  CALCIUM 9.0   GFR: Estimated Creatinine Clearance: 115 mL/min (by C-G formula based on SCr of 0.93 mg/dL). Liver Function Tests: Recent Labs  Lab 12/30/20 0053 12/30/20 0846  AST 79* 69*  ALT 117* 103*  ALKPHOS 78 75  BILITOT 1.8* 2.2*  PROT 6.4* 6.1*  ALBUMIN 3.4* 3.3*   Recent Labs  Lab 12/30/20 0053 12/30/20 0846  LIPASE 73* 57*   No results for input(s): AMMONIA in the last 168 hours. Coagulation Profile: No results for input(s): INR, PROTIME in the last 168 hours. Cardiac Enzymes: No results for input(s): CKTOTAL, CKMB, CKMBINDEX, TROPONINI in the last 168 hours. BNP (last 3 results) No results for input(s): PROBNP in the last 8760 hours. HbA1C: No results for input(s): HGBA1C in the last 72 hours. CBG: No results for input(s): GLUCAP in the last 168 hours. Lipid Profile: No results for input(s): CHOL, HDL, LDLCALC, TRIG, CHOLHDL, LDLDIRECT in the last 72 hours. Thyroid Function Tests: No results for input(s): TSH,  T4TOTAL, FREET4, T3FREE, THYROIDAB in the last 72 hours. Anemia Panel: No results for input(s): VITAMINB12, FOLATE, FERRITIN, TIBC, IRON, RETICCTPCT in the last 72 hours. Urine analysis:    Component Value Date/Time   COLORURINE YELLOW 12/30/2020 1150   APPEARANCEUR CLEAR 12/30/2020 1150   LABSPEC 1.017 12/30/2020 1150   PHURINE 6.0 12/30/2020 1150   GLUCOSEU NEGATIVE 12/30/2020 [place] 12/30/2020 [place] 12/30/2020 1150   KETONESUR 5 (A) 12/30/2020 [place] 12/30/2020 1150   NITRITE NEGATIVE 12/30/2020 [place] 12/30/2020 1150    Radiological Exams on Admission: US Abdomen Limited  Result Date: 12/30/2020 CLINICAL DATA:  Epigastric abdominal pain starting yesterday EXAM: ULTRASOUND ABDOMEN LIMITED RIGHT UPPER QUADRANT COMPARISON:  CT abdomen 11/13/2016 FINDINGS: Gallbladder: No gallstones or wall thickening visualized. No sonographic Murphy sign noted by sonographer. Common bile duct: Diameter: 0.6 cm, mildly dilated, without directly visualized choledocholithiasis. Liver: No focal lesion identified. Mild intrahepatic biliary dilatation. Portal vein is patent on color Doppler imaging with normal direction of blood flow towards the liver. Other: None. IMPRESSION: 1. Mild intrahepatic and extrahepatic biliary dilatation. No directly visualized choledocholithiasis, although given the elevated bilirubin levels, the possibility of a distal CBD obstructive process cannot be excluded. Further workup possibilities might include MRCP (if the patient is capable of suspending respiration and holding still), or CT abdomen for further characterization. Electronically Signed   By: Van Clines M.D.   On: 12/30/2020 13:05   MR ABDOMEN MRCP W WO CONTAST  Result Date: 12/30/2020 CLINICAL DATA:  Right upper quadrant abdominal pain. EXAM: MRI ABDOMEN WITHOUT AND WITH CONTRAST (INCLUDING MRCP) TECHNIQUE: Multiplanar multisequence MR  imaging of the abdomen was performed both before and after the administration of intravenous contrast. Heavily T2-weighted images of the biliary and pancreatic ducts were obtained, and three-dimensional MRCP images were rendered by post processing. CONTRAST:  38m GADAVIST GADOBUTROL 1 MMOL/ML IV SOLN COMPARISON:  Same day ultrasound and CT delete that November 13, 2016 FINDINGS: Lower chest: Bibasilar atelectasis. Normal size heart. No significant pericardial effusion/thickening. Hepatobiliary: No hepatic steatosis. No suspicious hepatic lesions. Gallbladder is unremarkable without stones or wall thickening. No biliary ductal dilation. Pancreas: Mild decrease intrinsic T1 signal in the pancreatic head/uncinate process with adjacent inflammatory stranding. No pancreatic ductal dilation. No areas of non pancreatic enhancement. No walled off fluid collections. Spleen:  Within normal limits Adrenals/Urinary Tract: Horseshoe kidney. No hydronephrosis. No suspicious renal masses. Stomach/Bowel: Visualized portions within the abdomen are unremarkable. Vascular/Lymphatic: No pathologically enlarged lymph nodes identified. No abdominal aortic aneurysm demonstrated. Other:  None. Musculoskeletal: No suspicious bone lesions identified. IMPRESSION: 1. Mild acute interstitial pancreatitis. No walled off fluid collections or pancreatic ductal dilation. 2. No biliary ductal dilation, cholelithiasis or choledocholithiasis. Electronically Signed   By: JDahlia BailiffMD   On: 12/30/2020 20:54    EKG: Independently reviewed.  Sinus rhythm, no prior tracing for comparison.  Assessment/Plan Principal Problem:   Acute pancreatitis Active Problems:   Alcohol abuse   Thrombocytopenia (HCC)   Tobacco use   Acute alcoholic pancreatitis Presenting with 2-day history of epigastric abdominal pain radiating to his back.  Reports heavy alcohol use.  Lipase and LFTs slightly elevated.  There was concern for mild intrahepatic and  extrahepatic biliary dilatation on ultrasound without any visualized gallstones.  MRCP was done and showing mild acute interstitial pancreatitis.  No walled off fluid collections or pancreatic ductal dilatation.  No biliary ductal dilatation, cholelithiasis, or choledocholithiasis.  No fever, leukocytosis, or signs of sepsis.  No nausea or vomiting and he is able to tolerate p.o. intake.  However, complaining of abdominal pain. -Admitted for observation.  IV fluid hydration and pain control.  Trend lipase and LFTs.  Alcohol abuse No signs of withdrawal at this time but drinks heavily and is at risk for withdrawal during his hospital stay. -CIWA protocol; Ativan as needed.  Thiamine, folate, and multivitamin.  Check mag and Phos levels.  Mild thrombocytopenia Likely due to alcohol abuse. -Continue to monitor  Tobacco use -NicoDerm patch and counseled to quit.  DVT prophylaxis: Lovenox Code Status: Full code Family Communication: No family at bedside. Disposition Plan: Status is: Observation  The patient remains OBS appropriate and will d/c before 2 midnights.  Dispo: The patient is from: Home              Anticipated d/c is to: Home              Patient currently is not medically stable to d/c.   Difficult to place patient No  Level of care: Level of care: Med-Surg   The medical decision making on this patient was of high complexity and the patient is at high risk for clinical deterioration, therefore this is a level 3 visit.  Shela Leff MD [facility]  If 7PM-7AM, please contact night-coverage [email or web address]  12/31/2020, 1:30 AM

## 2021-08-22 ENCOUNTER — Emergency Department (HOSPITAL_COMMUNITY): Payer: Self-pay

## 2021-08-22 ENCOUNTER — Other Ambulatory Visit: Payer: Self-pay

## 2021-08-22 ENCOUNTER — Emergency Department (HOSPITAL_COMMUNITY)
Admission: EM | Admit: 2021-08-22 | Discharge: 2021-08-22 | Discharge status: Against Medical Advice | Payer: Self-pay | Attending: Emergency Medicine | Admitting: Emergency Medicine

## 2021-08-22 DIAGNOSIS — K0889 Other specified disorders of teeth and supporting structures: Secondary | ICD-10-CM | POA: Insufficient documentation

## 2021-08-22 DIAGNOSIS — Z5321 Procedure and treatment not carried out due to patient leaving prior to being seen by health care provider: Secondary | ICD-10-CM | POA: Insufficient documentation

## 2021-08-22 LAB — CBC WITH DIFFERENTIAL/PLATELET
Abs Immature Granulocytes: 0.03 10*3/uL (ref 0.00–0.07)
Basophils Absolute: 0 10*3/uL (ref 0.0–0.1)
Basophils Relative: 0 %
Eosinophils Absolute: 0.1 10*3/uL (ref 0.0–0.5)
Eosinophils Relative: 1 %
HCT: 41.3 % (ref 39.0–52.0)
Hemoglobin: 14.7 g/dL (ref 13.0–17.0)
Immature Granulocytes: 0 %
Lymphocytes Relative: 8 %
Lymphs Abs: 0.5 10*3/uL — ABNORMAL LOW (ref 0.7–4.0)
MCH: 35.3 pg — ABNORMAL HIGH (ref 26.0–34.0)
MCHC: 35.6 g/dL (ref 30.0–36.0)
MCV: 99.3 fL (ref 80.0–100.0)
Monocytes Absolute: 0.4 10*3/uL (ref 0.1–1.0)
Monocytes Relative: 7 %
Neutro Abs: 5.6 10*3/uL (ref 1.7–7.7)
Neutrophils Relative %: 84 %
Platelets: 105 10*3/uL — ABNORMAL LOW (ref 150–400)
RBC: 4.16 MIL/uL — ABNORMAL LOW (ref 4.22–5.81)
RDW: 11.9 % (ref 11.5–15.5)
WBC: 6.7 10*3/uL (ref 4.0–10.5)
nRBC: 0 % (ref 0.0–0.2)

## 2021-08-22 LAB — BASIC METABOLIC PANEL
Anion gap: 7 (ref 5–15)
BUN: 10 mg/dL (ref 6–20)
CO2: 26 mmol/L (ref 22–32)
Calcium: 9 mg/dL (ref 8.9–10.3)
Chloride: 98 mmol/L (ref 98–111)
Creatinine, Ser: 0.8 mg/dL (ref 0.61–1.24)
GFR, Estimated: >60 mL/min (ref >60)
Glucose, Bld: 115 mg/dL — ABNORMAL HIGH (ref 70–99)
Potassium: 4 mmol/L (ref 3.5–5.1)
Sodium: 131 mmol/L — ABNORMAL LOW (ref 135–145)

## 2021-08-22 MED ORDER — IOHEXOL 350 MG/ML SOLN
75.0000 mL | Freq: Once | INTRAVENOUS | Status: AC | PRN
  Administered 2021-08-22: 75 mL via INTRAVENOUS

## 2021-08-22 NOTE — ED Provider Triage Note (Signed)
Emergency Medicine Provider Triage Evaluation Note  Stephen Bender , a 46 y.o. male  was evaluated in triage.  Pt complains of right dental pain.   He has had right lower dental pain for some time seems over the past few days it has become significantly worse.  Was seen by dentist yesterday and started on penicillin.  States he woke up this morning with significant swelling to his right jaw and now has difficulty opening his mouth.  Review of Systems  Positive: Dental pain Negative: Fever   Physical Exam  There were no vitals taken for this visit. Gen:   Awake, no distress   Resp:  Normal effort  MSK:   Moves extremities without difficulty  Other:  Swelling to right jaw and 2-finger trismus   Medical Decision Making  Medically screening exam initiated at 6:40 AM.  Appropriate orders placed.  Stephen Bender was informed that the remainder of the evaluation will be completed by another provider, this initial triage assessment does not replace that evaluation, and the importance of remaining in the ED until their evaluation is complete.  CT and labs ordered   Gailen Shelter, [place] 08/22/21 [id number]

## 2021-08-22 NOTE — ED Triage Notes (Signed)
Patient had toothache Wednesday morning, went to dentist Thursday, started on penicillin. Patient woke up this AM and swelling had drastically increased.  Patient presents with swollen right side of face, Able to manage secretions. Airway intact

## 2021-08-22 NOTE — ED Notes (Signed)
[name] although being advised to stay. [name].

## 2021-08-22 NOTE — ED Notes (Signed)
Pt left ED.

## 2021-10-25 ENCOUNTER — Emergency Department (HOSPITAL_COMMUNITY): Payer: Self-pay

## 2021-10-25 ENCOUNTER — Other Ambulatory Visit: Payer: Self-pay

## 2021-10-25 ENCOUNTER — Encounter (HOSPITAL_COMMUNITY): Payer: Self-pay | Admitting: Emergency Medicine

## 2021-10-25 ENCOUNTER — Inpatient Hospital Stay (HOSPITAL_COMMUNITY)
Admission: EM | Admit: 2021-10-25 | Discharge: 2021-10-29 | DRG: 439 | Disposition: A | Discharge status: Home / Self Care | Payer: Self-pay | Attending: Internal Medicine | Admitting: Internal Medicine

## 2021-10-25 DIAGNOSIS — Z20822 Contact with and (suspected) exposure to covid-19: Secondary | ICD-10-CM | POA: Diagnosis present

## 2021-10-25 DIAGNOSIS — E872 Acidosis, unspecified: Secondary | ICD-10-CM

## 2021-10-25 DIAGNOSIS — E871 Hypo-osmolality and hyponatremia: Secondary | ICD-10-CM

## 2021-10-25 DIAGNOSIS — E781 Pure hyperglyceridemia: Secondary | ICD-10-CM

## 2021-10-25 DIAGNOSIS — R0600 Dyspnea, unspecified: Secondary | ICD-10-CM

## 2021-10-25 DIAGNOSIS — E860 Dehydration: Secondary | ICD-10-CM | POA: Diagnosis present

## 2021-10-25 DIAGNOSIS — K859 Acute pancreatitis without necrosis or infection, unspecified: Secondary | ICD-10-CM | POA: Diagnosis present

## 2021-10-25 DIAGNOSIS — D6959 Other secondary thrombocytopenia: Secondary | ICD-10-CM | POA: Diagnosis present

## 2021-10-25 DIAGNOSIS — J90 Pleural effusion, not elsewhere classified: Secondary | ICD-10-CM | POA: Diagnosis present

## 2021-10-25 DIAGNOSIS — F10939 Alcohol use, unspecified with withdrawal, unspecified: Secondary | ICD-10-CM

## 2021-10-25 DIAGNOSIS — F17210 Nicotine dependence, cigarettes, uncomplicated: Secondary | ICD-10-CM | POA: Diagnosis present

## 2021-10-25 DIAGNOSIS — K760 Fatty (change of) liver, not elsewhere classified: Secondary | ICD-10-CM | POA: Diagnosis present

## 2021-10-25 DIAGNOSIS — W19XXXA Unspecified fall, initial encounter: Secondary | ICD-10-CM

## 2021-10-25 DIAGNOSIS — D696 Thrombocytopenia, unspecified: Secondary | ICD-10-CM | POA: Diagnosis present

## 2021-10-25 DIAGNOSIS — K219 Gastro-esophageal reflux disease without esophagitis: Secondary | ICD-10-CM | POA: Diagnosis present

## 2021-10-25 DIAGNOSIS — R6510 Systemic inflammatory response syndrome (SIRS) of non-infectious origin without acute organ dysfunction: Secondary | ICD-10-CM | POA: Diagnosis present

## 2021-10-25 DIAGNOSIS — K852 Alcohol induced acute pancreatitis without necrosis or infection: Principal | ICD-10-CM | POA: Diagnosis present

## 2021-10-25 DIAGNOSIS — R1010 Upper abdominal pain, unspecified: Principal | ICD-10-CM

## 2021-10-25 DIAGNOSIS — F10239 Alcohol dependence with withdrawal, unspecified: Secondary | ICD-10-CM | POA: Diagnosis present

## 2021-10-25 DIAGNOSIS — E861 Hypovolemia: Secondary | ICD-10-CM | POA: Diagnosis present

## 2021-10-25 DIAGNOSIS — E876 Hypokalemia: Secondary | ICD-10-CM | POA: Diagnosis present

## 2021-10-25 DIAGNOSIS — R739 Hyperglycemia, unspecified: Secondary | ICD-10-CM

## 2021-10-25 DIAGNOSIS — R112 Nausea with vomiting, unspecified: Secondary | ICD-10-CM

## 2021-10-25 DIAGNOSIS — K7011 Alcoholic hepatitis with ascites: Secondary | ICD-10-CM | POA: Diagnosis present

## 2021-10-25 DIAGNOSIS — R7989 Other specified abnormal findings of blood chemistry: Secondary | ICD-10-CM

## 2021-10-25 DIAGNOSIS — N179 Acute kidney failure, unspecified: Secondary | ICD-10-CM

## 2021-10-25 DIAGNOSIS — Q631 Lobulated, fused and horseshoe kidney: Secondary | ICD-10-CM

## 2021-10-25 HISTORY — DX: Alcohol abuse, uncomplicated: F10.10

## 2021-10-25 HISTORY — DX: Acute pancreatitis without necrosis or infection, unspecified: K85.90

## 2021-10-25 LAB — CBC WITH DIFFERENTIAL/PLATELET
Abs Immature Granulocytes: 0.03 10*3/uL (ref 0.00–0.07)
Basophils Absolute: 0 10*3/uL (ref 0.0–0.1)
Basophils Relative: 0 %
Eosinophils Absolute: 0 10*3/uL (ref 0.0–0.5)
Eosinophils Relative: 0 %
HCT: 61.4 % — ABNORMAL HIGH (ref 39.0–52.0)
Hemoglobin: 21.5 g/dL (ref 13.0–17.0)
Immature Granulocytes: 0 %
Lymphocytes Relative: 4 %
Lymphs Abs: 0.3 10*3/uL — ABNORMAL LOW (ref 0.7–4.0)
MCH: 35.2 pg — ABNORMAL HIGH (ref 26.0–34.0)
MCHC: 35 g/dL (ref 30.0–36.0)
MCV: 100.5 fL — ABNORMAL HIGH (ref 80.0–100.0)
Monocytes Absolute: 0.3 10*3/uL (ref 0.1–1.0)
Monocytes Relative: 5 %
Neutro Abs: 6.6 10*3/uL (ref 1.7–7.7)
Neutrophils Relative %: 91 %
Platelets: 63 10*3/uL — ABNORMAL LOW (ref 150–400)
RBC: 6.11 MIL/uL — ABNORMAL HIGH (ref 4.22–5.81)
RDW: 13.7 % (ref 11.5–15.5)
WBC: 7.3 10*3/uL (ref 4.0–10.5)
nRBC: 0 % (ref 0.0–0.2)

## 2021-10-25 LAB — COMPREHENSIVE METABOLIC PANEL
ALT: 161 U/L — ABNORMAL HIGH (ref 0–44)
AST: 334 U/L — ABNORMAL HIGH (ref 15–41)
Albumin: 3.5 g/dL (ref 3.5–5.0)
Alkaline Phosphatase: 113 U/L (ref 38–126)
Anion gap: 20 — ABNORMAL HIGH (ref 5–15)
BUN: 25 mg/dL — ABNORMAL HIGH (ref 6–20)
CO2: 16 mmol/L — ABNORMAL LOW (ref 22–32)
Calcium: 7.8 mg/dL — ABNORMAL LOW (ref 8.9–10.3)
Chloride: 93 mmol/L — ABNORMAL LOW (ref 98–111)
Creatinine, Ser: 2.68 mg/dL — ABNORMAL HIGH (ref 0.61–1.24)
GFR, Estimated: 29 mL/min — ABNORMAL LOW (ref >60)
Glucose, Bld: 170 mg/dL — ABNORMAL HIGH (ref 70–99)
Potassium: 4.8 mmol/L (ref 3.5–5.1)
Sodium: 129 mmol/L — ABNORMAL LOW (ref 135–145)
Total Bilirubin: 3.1 mg/dL — ABNORMAL HIGH (ref 0.3–1.2)
Total Protein: 6.9 g/dL (ref 6.5–8.1)

## 2021-10-25 LAB — CBG MONITORING, ED
Glucose-Capillary: 118 mg/dL — ABNORMAL HIGH (ref 70–99)
Glucose-Capillary: 113 mg/dL — ABNORMAL HIGH (ref 70–99)
Glucose-Capillary: 138 mg/dL — ABNORMAL HIGH (ref 70–99)
Glucose-Capillary: 159 mg/dL — ABNORMAL HIGH (ref 70–99)

## 2021-10-25 LAB — URINALYSIS, ROUTINE W REFLEX MICROSCOPIC
Bacteria, UA: NONE SEEN
Bilirubin Urine: NEGATIVE
Glucose, UA: NEGATIVE mg/dL
Hgb urine dipstick: NEGATIVE
Ketones, ur: 5 mg/dL — AB
Leukocytes,Ua: NEGATIVE
Nitrite: NEGATIVE
Protein, ur: 30 mg/dL — AB
Specific Gravity, Urine: 1.025 (ref 1.005–1.030)
pH: 5 (ref 5.0–8.0)

## 2021-10-25 LAB — BASIC METABOLIC PANEL
Anion gap: 11 (ref 5–15)
BUN: 30 mg/dL — ABNORMAL HIGH (ref 6–20)
CO2: 18 mmol/L — ABNORMAL LOW (ref 22–32)
Calcium: 6.6 mg/dL — ABNORMAL LOW (ref 8.9–10.3)
Chloride: 99 mmol/L (ref 98–111)
Creatinine, Ser: 2.28 mg/dL — ABNORMAL HIGH (ref 0.61–1.24)
GFR, Estimated: 35 mL/min — ABNORMAL LOW (ref >60)
Glucose, Bld: 147 mg/dL — ABNORMAL HIGH (ref 70–99)
Potassium: 5.1 mmol/L (ref 3.5–5.1)
Sodium: 128 mmol/L — ABNORMAL LOW (ref 135–145)

## 2021-10-25 LAB — CBC
HCT: 58.3 % — ABNORMAL HIGH (ref 39.0–52.0)
Hemoglobin: 19.9 g/dL — ABNORMAL HIGH (ref 13.0–17.0)
MCH: 34.4 pg — ABNORMAL HIGH (ref 26.0–34.0)
MCHC: 34.1 g/dL (ref 30.0–36.0)
MCV: 100.9 fL — ABNORMAL HIGH (ref 80.0–100.0)
Platelets: 57 10*3/uL — ABNORMAL LOW (ref 150–400)
RBC: 5.78 MIL/uL (ref 4.22–5.81)
RDW: 13.7 % (ref 11.5–15.5)
WBC: 7.8 10*3/uL (ref 4.0–10.5)
nRBC: 0 % (ref 0.0–0.2)

## 2021-10-25 LAB — RESP PANEL BY RT-PCR (FLU A&B, COVID) ARPGX2
Influenza A by PCR: NEGATIVE
Influenza B by PCR: NEGATIVE
SARS Coronavirus 2 by RT PCR: NEGATIVE

## 2021-10-25 LAB — LIPID PANEL
Cholesterol: 173 mg/dL (ref 0–200)
Cholesterol: 147 mg/dL (ref 0–200)
HDL: <10 mg/dL — ABNORMAL LOW (ref >40)
HDL: <10 mg/dL — ABNORMAL LOW (ref >40)
LDL Cholesterol: UNDETERMINED mg/dL (ref 0–99)
LDL Cholesterol: UNDETERMINED mg/dL (ref 0–99)
Triglycerides: 915 mg/dL — ABNORMAL HIGH (ref <150)
Triglycerides: 756 mg/dL — ABNORMAL HIGH (ref <150)
VLDL: UNDETERMINED mg/dL (ref 0–40)
VLDL: UNDETERMINED mg/dL (ref 0–40)

## 2021-10-25 LAB — LACTATE DEHYDROGENASE: LDH: 388 U/L — ABNORMAL HIGH (ref 98–192)

## 2021-10-25 LAB — GLUCOSE, CAPILLARY
Glucose-Capillary: 127 mg/dL — ABNORMAL HIGH (ref 70–99)
Glucose-Capillary: 146 mg/dL — ABNORMAL HIGH (ref 70–99)
Glucose-Capillary: 152 mg/dL — ABNORMAL HIGH (ref 70–99)
Glucose-Capillary: 174 mg/dL — ABNORMAL HIGH (ref 70–99)

## 2021-10-25 LAB — LDL CHOLESTEROL, DIRECT
Direct LDL: 13.6 mg/dL (ref 0–99)
Direct LDL: 14.6 mg/dL (ref 0–99)

## 2021-10-25 LAB — PROTIME-INR
INR: 0.9 (ref 0.8–1.2)
Prothrombin Time: 12.3 seconds (ref 11.4–15.2)

## 2021-10-25 LAB — LIPASE, BLOOD: Lipase: 897 U/L — ABNORMAL HIGH (ref 11–51)

## 2021-10-25 LAB — ETHANOL: Alcohol, Ethyl (B): <10 mg/dL (ref <10)

## 2021-10-25 LAB — LACTIC ACID, PLASMA
Lactic Acid, Venous: 4.5 mmol/L (ref 0.5–1.9)
Lactic Acid, Venous: 1.6 mmol/L (ref 0.5–1.9)

## 2021-10-25 MED ORDER — VITAMIN B-12 1000 MCG PO TABS
1000.0000 ug | ORAL_TABLET | Freq: Every day | ORAL | Status: DC
  Administered 2021-10-25 – 2021-10-29 (×5): 1000 ug via ORAL
  Filled 2021-10-25 (×5): qty 1

## 2021-10-25 MED ORDER — ONDANSETRON HCL 4 MG/2ML IJ SOLN
4.0000 mg | Freq: Four times a day (QID) | INTRAMUSCULAR | Status: DC | PRN
  Administered 2021-10-25 – 2021-10-26 (×3): 4 mg via INTRAVENOUS
  Filled 2021-10-25 (×4): qty 2

## 2021-10-25 MED ORDER — THIAMINE HCL 100 MG PO TABS
250.0000 mg | ORAL_TABLET | Freq: Every day | ORAL | Status: DC
Start: 2021-10-25 — End: 2021-10-29
  Administered 2021-10-25 – 2021-10-29 (×5): 250 mg via ORAL
  Filled 2021-10-25 (×5): qty 3

## 2021-10-25 MED ORDER — CALCIUM GLUCONATE-NACL 1-0.675 GM/50ML-% IV SOLN
1.0000 g | Freq: Once | INTRAVENOUS | Status: AC
  Administered 2021-10-25: 1000 mg via INTRAVENOUS
  Filled 2021-10-25: qty 50

## 2021-10-25 MED ORDER — SODIUM CHLORIDE 0.9 % IV BOLUS
1000.0000 mL | Freq: Once | INTRAVENOUS | Status: AC
  Administered 2021-10-25: 1000 mL via INTRAVENOUS

## 2021-10-25 MED ORDER — ONDANSETRON HCL 4 MG/2ML IJ SOLN
4.0000 mg | Freq: Once | INTRAMUSCULAR | Status: AC
  Administered 2021-10-25: 4 mg via INTRAVENOUS
  Filled 2021-10-25: qty 2

## 2021-10-25 MED ORDER — LORAZEPAM 2 MG/ML IJ SOLN
1.0000 mg | INTRAMUSCULAR | Status: DC | PRN
  Administered 2021-10-26: 2 mg via INTRAVENOUS
  Filled 2021-10-25: qty 1

## 2021-10-25 MED ORDER — ONDANSETRON 4 MG PO TBDP
4.0000 mg | ORAL_TABLET | Freq: Once | ORAL | Status: DC
  Filled 2021-10-25: qty 1

## 2021-10-25 MED ORDER — MORPHINE SULFATE (PF) 2 MG/ML IV SOLN: 2.0000 mg | INTRAVENOUS | Status: DC | PRN

## 2021-10-25 MED ORDER — LORAZEPAM 2 MG/ML IJ SOLN
0.5000 mg | Freq: Once | INTRAMUSCULAR | Status: AC
  Administered 2021-10-25: 0.5 mg via INTRAVENOUS
  Filled 2021-10-25: qty 1

## 2021-10-25 MED ORDER — INSULIN ASPART 100 UNIT/ML IJ SOLN: 0.0000 [IU] | Freq: Three times a day (TID) | INTRAMUSCULAR | Status: DC

## 2021-10-25 MED ORDER — FENTANYL CITRATE PF 50 MCG/ML IJ SOSY
50.0000 ug | PREFILLED_SYRINGE | Freq: Once | INTRAMUSCULAR | Status: AC
  Administered 2021-10-25: 50 ug via INTRAVENOUS
  Filled 2021-10-25: qty 1

## 2021-10-25 MED ORDER — PANTOPRAZOLE SODIUM 40 MG PO TBEC
40.0000 mg | DELAYED_RELEASE_TABLET | Freq: Every day | ORAL | Status: DC
  Administered 2021-10-25 – 2021-10-29 (×5): 40 mg via ORAL
  Filled 2021-10-25 (×5): qty 1

## 2021-10-25 MED ORDER — DEXTROSE-NACL 5-0.9 % IV SOLN: INTRAVENOUS | Status: DC

## 2021-10-25 MED ORDER — SODIUM CHLORIDE 0.9 % IV SOLN
INTRAVENOUS | Status: DC
  Filled 2021-10-25 (×2): qty 1000

## 2021-10-25 MED ORDER — HYDROMORPHONE HCL 1 MG/ML IJ SOLN
0.5000 mg | Freq: Once | INTRAMUSCULAR | Status: DC
  Administered 2021-10-25: 0.5 mg via INTRAVENOUS
  Filled 2021-10-25: qty 1

## 2021-10-25 MED ORDER — SODIUM CHLORIDE 0.9 % IV BOLUS: 1000.0000 mL | Freq: Once | INTRAVENOUS | Status: DC

## 2021-10-25 MED ORDER — NICOTINE 21 MG/24HR TD PT24
21.0000 mg | MEDICATED_PATCH | Freq: Every day | TRANSDERMAL | Status: DC
  Administered 2021-10-25 – 2021-10-29 (×5): 21 mg via TRANSDERMAL
  Filled 2021-10-25 (×7): qty 1

## 2021-10-25 MED ORDER — SODIUM CHLORIDE 0.9 % IV SOLN: INTRAVENOUS | Status: DC

## 2021-10-25 MED ORDER — MORPHINE SULFATE (PF) 4 MG/ML IV SOLN
4.0000 mg | INTRAVENOUS | Status: DC | PRN
  Administered 2021-10-25 – 2021-10-26 (×8): 4 mg via INTRAVENOUS
  Filled 2021-10-25 (×8): qty 1

## 2021-10-25 MED ORDER — INSULIN REGULAR(HUMAN) IN NACL 100-0.9 UT/100ML-% IV SOLN
INTRAVENOUS | Status: DC
  Administered 2021-10-25: 0.8 [IU]/h via INTRAVENOUS
  Filled 2021-10-25: qty 100

## 2021-10-25 MED ORDER — ONDANSETRON HCL 4 MG PO TABS: 4.0000 mg | ORAL_TABLET | Freq: Four times a day (QID) | ORAL | Status: DC | PRN

## 2021-10-25 MED ORDER — LORAZEPAM 1 MG PO TABS
1.0000 mg | ORAL_TABLET | ORAL | Status: DC | PRN
  Administered 2021-10-25 – 2021-10-26 (×3): 1 mg via ORAL
  Filled 2021-10-25 (×3): qty 1

## 2021-10-25 NOTE — Plan of Care (Signed)
New admit

## 2021-10-25 NOTE — ED Notes (Signed)
Dr Gilford Raid notified of hgb of 21 ?

## 2021-10-25 NOTE — ED Notes (Signed)
PA notified of elevated lactic  

## 2021-10-25 NOTE — ED Provider Notes (Signed)
[facility] Provider Note   CSN: [id number] Arrival date & time: 10/25/21  0759     History  Chief Complaint  Patient presents with   Abdominal Pain    [facility] is a 46 y.o. male with a history of alcohol abuse and acute pancreatitis presenting today for 24 hours of abdominal pain, nausea and vomiting.  Abdominal pain woke him up out of bed.  Pain described as sharp and constant with radiation to the center of his back.  Denies constipation, diarrhea, or urinary symptoms.  Notes urine is a dark amber color.  Unable to keep down fluids or solids.  Denies recent illness, fever, chest pain, shortness of breath, syncope.  Notes feeling dizzy/lightheaded since last night.  Endorses orthopnea due to the abdominal pain.  Denies alcohol use in the last few days.  No history of abdominal surgeries.  Current tobacco use.  The history is provided by the patient, medical records and a relative (Mother).  Abdominal Pain Associated symptoms: nausea and vomiting       Home Medications Prior to Admission medications   Medication Sig Start Date End Date Taking? Authorizing Provider  Tetrahydrozoline HCl (VISINE OP) Place 1 drop into both eyes daily as needed (dry eyes).   Yes [provider]  pantoprazole (PROTONIX) 40 MG tablet Take 1 tablet (40 mg total) by mouth daily. Patient not taking: Reported on 10/25/2021 12/31/20 12/31/21  Kathie Dike, MD      Allergies    Patient has no known allergies.    Review of Systems   Review of Systems  Gastrointestinal:  Positive for abdominal pain, nausea and vomiting.   Physical Exam Updated Vital Signs BP 119/78    Pulse (!) 119    Temp (!) 97.4 F (36.3 C) (Axillary)    Resp (!) 26    Ht 6' 4"  (1.93 m)    Wt 72.6 kg    SpO2 93%    BMI 19.48 kg/m  Physical Exam Vitals and nursing note reviewed.  Constitutional:      General: He is not in acute distress.    Appearance: He is well-developed. He  is not ill-appearing or diaphoretic.  HENT:     Head: Normocephalic and atraumatic.  Eyes:     General: No scleral icterus.    Conjunctiva/sclera: Conjunctivae normal.  Cardiovascular:     Rate and Rhythm: Regular rhythm. Tachycardia present.     Heart sounds: Normal heart sounds. No murmur heard. Pulmonary:     Effort: Pulmonary effort is normal. No respiratory distress.     Breath sounds: Normal breath sounds.  Abdominal:     General: Abdomen is flat. Bowel sounds are normal. There is no distension. There are no signs of injury.     Palpations: Abdomen is soft.     Tenderness: There is abdominal tenderness in the epigastric area and periumbilical area.  Musculoskeletal:        General: No swelling.     Cervical back: Neck supple.  Skin:    General: Skin is warm and dry.     Capillary Refill: Capillary refill takes less than 2 seconds.  Neurological:     Mental Status: He is alert and oriented to person, place, and time.  Psychiatric:        Mood and Affect: Mood normal.    ED Results / Procedures / Treatments   Labs (all labs ordered are listed, but only abnormal results are displayed) Labs Reviewed  LIPASE, BLOOD - Abnormal; Notable for the following components:      Result Value   Lipase 897 (*)    All other components within normal limits  COMPREHENSIVE METABOLIC PANEL - Abnormal; Notable for the following components:   Sodium 129 (*)    Chloride 93 (*)    CO2 16 (*)    Glucose, Bld 170 (*)    BUN 25 (*)    Creatinine, Ser 2.68 (*)    Calcium 7.8 (*)    AST 334 (*)    ALT 161 (*)    Total Bilirubin 3.1 (*)    GFR, Estimated 29 (*)    Anion gap 20 (*)    All other components within normal limits  URINALYSIS, ROUTINE W REFLEX MICROSCOPIC - Abnormal; Notable for the following components:   Color, Urine AMBER (*)    APPearance HAZY (*)    Ketones, ur 5 (*)    Protein, ur 30 (*)    All other components within normal limits  CBC WITH DIFFERENTIAL/PLATELET -  Abnormal; Notable for the following components:   RBC 6.11 (*)    Hemoglobin 21.5 (*)    HCT 61.4 (*)    MCV 100.5 (*)    MCH 35.2 (*)    Platelets 63 (*)    Lymphs Abs 0.3 (*)    All other components within normal limits  LACTIC ACID, PLASMA - Abnormal; Notable for the following components:   Lactic Acid, Venous 4.5 (*)    All other components within normal limits  CBC - Abnormal; Notable for the following components:   Hemoglobin 19.9 (*)    HCT 58.3 (*)    MCV 100.9 (*)    MCH 34.4 (*)    Platelets 57 (*)    All other components within normal limits  LACTATE DEHYDROGENASE - Abnormal; Notable for the following components:   LDH 388 (*)    All other components within normal limits  LIPID PANEL - Abnormal; Notable for the following components:   Triglycerides 915 (*)    HDL <10 (*)    All other components within normal limits  CBG MONITORING, ED - Abnormal; Notable for the following components:   Glucose-Capillary 118 (*)    All other components within normal limits  RESP PANEL BY RT-PCR (FLU A&B, COVID) ARPGX2  CULTURE, BLOOD (ROUTINE X 2)  ETHANOL  PROTIME-INR  HEMOGLOBIN N4M  BASIC METABOLIC PANEL  LDL CHOLESTEROL, DIRECT    EKG None  Radiology CT ABDOMEN PELVIS WO CONTRAST  Result Date: 10/25/2021 CLINICAL DATA:  Acute, severe pancreatitis. Alcohol abuse. Thrombocytopenia. EXAM: CT ABDOMEN AND PELVIS WITHOUT CONTRAST TECHNIQUE: Multidetector CT imaging of the abdomen and pelvis was performed following the standard protocol without IV contrast. RADIATION DOSE REDUCTION: This exam was performed according to the departmental dose-optimization program which includes automated exposure control, adjustment of the mA and/or kV according to patient size and/or use of iterative reconstruction technique. COMPARISON:  Ultrasound earlier today. Abdominal MRI 12/30/2020. CT 11/13/2016 FINDINGS: Lower chest: Clear lung bases. Normal heart size without pericardial or pleural  effusion. Hepatobiliary: Marked hepatic steatosis, with mild hepatomegaly at 18.1 cm craniocaudal. No calcified gallstones.  No biliary duct dilatation. Pancreas: Marked peripancreatic edema with fluid throughout the anterior pararenal space, extending into the omentum and along the paracolic gutters. Limited evaluation for more well defined fluid collection, given lack of oral or IV contrast. Spleen: Normal in size, without focal abnormality. Adrenals/Urinary Tract: Normal adrenal glands. Horseshoe kidney, without hydronephrosis or  bladder stone. Stomach/Bowel: The antro pyloric region is displaced anteriorly by the peripancreatic fluid. No gastric outlet obstruction. Otherwise normal small bowel. Normal terminal ileum and appendix. Vascular/Lymphatic: Normal caliber of the aorta and branch vessels. No abdominopelvic adenopathy. Reproductive: Normal prostate. Other: Small volume abdominopelvic ascites. Musculoskeletal: No acute osseous abnormality. IMPRESSION: 1. Severe pancreatitis. Limited evaluation for focal fluid collection given lack of IV contrast. 2. Small volume abdominopelvic ascites. 3. Hepatic steatosis and hepatomegaly. 4. Horseshoe kidney. Electronically Signed   By: Abigail Miyamoto M.D.   On: 10/25/2021 11:36   US Abdomen Complete  Result Date: 10/25/2021 CLINICAL DATA:  Abdominal pain and vomiting for several days. Acute pancreatitis. EXAM: ABDOMEN ULTRASOUND COMPLETE COMPARISON:  12/30/2020 FINDINGS: Gallbladder: No gallstones or wall thickening visualized. No sonographic Murphy sign noted by sonographer. Common bile duct: Diameter: 5 mm, within normal limits. Liver: Diffusely increased echogenicity of the hepatic parenchyma, consistent with hepatic steatosis. No hepatic mass identified. Portal vein is patent on color Doppler imaging with normal direction of blood flow towards the liver. IVC: No abnormality visualized. Pancreas: Visualized portion of the pancreatic head and body appears prominent  with mild peripancreatic fluid, highly suspicious for acute pancreatitis. Spleen: Size and appearance within normal limits. Right Kidney: Length: 8.8 cm. Echogenicity within normal limits. No mass or hydronephrosis visualized. Left Kidney: Length: 8.4 cm. Limited visualization due to patient habitus and immobility. Echogenicity within normal limits. No mass or hydronephrosis visualized. Abdominal aorta: No aneurysm visualized. Other findings: Small amount of free fluid seen in Morison's pouch and the left upper quadrant. IMPRESSION: Probable acute pancreatitis, with small amount of peripancreatic and bilateral upper quadrant fluid. No evidence of gallstones or biliary ductal dilatation. Hepatic steatosis. Electronically Signed   By: Marlaine Hind M.D.   On: 10/25/2021 11:07   DG Chest Port 1 View  Result Date: 10/25/2021 CLINICAL DATA:  46 year old male with history of orthopnea. EXAM: PORTABLE CHEST 1 VIEW COMPARISON:  No priors. FINDINGS: Lung volumes are normal. No consolidative airspace disease. No pleural effusions. No pneumothorax. No pulmonary nodule or mass noted. Pulmonary vasculature and the cardiomediastinal silhouette are within normal limits. IMPRESSION: No radiographic evidence of acute cardiopulmonary disease. Electronically Signed   By: Vinnie Langton M.D.   On: 10/25/2021 11:02    Procedures .Critical Care Performed by: Prince Rome, PA-C Authorized by: Prince Rome, PA-C   Critical care provider statement:    Critical care time (minutes):  45   Critical care was necessary to treat or prevent imminent or life-threatening deterioration of the following conditions:  Shock (Severe pancreatitis with shock)   Critical care was time spent personally by me on the following activities:  Development of treatment plan with patient or surrogate, discussions with consultants, evaluation of patient's response to treatment, examination of patient, ordering and review of laboratory  studies, ordering and review of radiographic studies, ordering and performing treatments and interventions, pulse oximetry, re-evaluation of patient's condition, review of old charts, blood draw for specimens and obtaining history from patient or surrogate   Care discussed with: admitting provider      Medications Ordered in ED Medications  pantoprazole (PROTONIX) EC tablet 40 mg (40 mg Oral Given 10/25/21 1332)  LORazepam (ATIVAN) tablet 1-4 mg (1 mg Oral Given 10/25/21 1332)    Or  LORazepam (ATIVAN) injection 1-4 mg ( Intravenous See Alternative 10/25/21 1332)  ondansetron (ZOFRAN) tablet 4 mg (has no administration in time range)    Or  ondansetron (ZOFRAN) injection 4 mg (has  no administration in time range)  morphine (PF) 4 MG/ML injection 4 mg (4 mg Intravenous Given 10/25/21 1400)  insulin aspart (novoLOG) injection 0-9 Units (has no administration in time range)  0.9 %  sodium chloride infusion ( Intravenous New Bag/Given 10/25/21 1337)  thiamine tablet 250 mg (250 mg Oral Given 10/25/21 1359)  vitamin B-12 (CYANOCOBALAMIN) tablet 1,000 mcg (1,000 mcg Oral Given 10/25/21 1358)  sodium chloride 0.9 % bolus 1,000 mL (0 mLs Intravenous Stopped 10/25/21 1013)  ondansetron (ZOFRAN) injection 4 mg (4 mg Intravenous Given 10/25/21 0908)  sodium chloride 0.9 % bolus 1,000 mL (0 mLs Intravenous Stopped 10/25/21 1137)  fentaNYL (SUBLIMAZE) injection 50 mcg (50 mcg Intravenous Given 10/25/21 1219)  LORazepam (ATIVAN) injection 0.5 mg (0.5 mg Intravenous Given 10/25/21 1017)  sodium chloride 0.9 % bolus 1,000 mL (0 mLs Intravenous Stopped 10/25/21 1320)  calcium gluconate 1 g/ 50 mL sodium chloride IVPB (0 mg Intravenous Stopped 10/25/21 1458)    ED Course/ Medical Decision Making/ A&P                           Medical Decision Making Amount and/or Complexity of Data Reviewed External Data Reviewed: notes. Labs: ordered. Decision-making details documented in ED Course. Radiology: ordered and  independent interpretation performed. Decision-making details documented in ED Course. ECG/medicine tests: ordered and independent interpretation performed. Decision-making details documented in ED Course.  Risk Prescription drug management. Decision regarding hospitalization.  Critical Care Total time providing critical care: 20 minutes  46 y.o. male presents to the ED for concern of Abdominal Pain  With nausea and vomiting.  This involves an extensive number of treatment options, and is a complaint that carries with it a high risk of complications and morbidity.  The differential diagnosis includes pancreatitis, GERD, appendicitis, acute gallbladder, gastroenteritis  Comorbidities that complicate the patient evaluation include alcohol abuse, tobacco use, history of pancreatitis  Additional history obtained from internal/external records available via epic  Interpretation: I ordered, and personally interpreted labs.  The pertinent results include: Elevation of lactic acid at 4.5 and elevation of LDH at 388.  LFTs increased, with an AST of 334 and ALT of 161.  Lipase elevated at 897, with lactic acid and LDH and LFTs are strongly suggestive of pancreatic involvement.  Hemoglobin initially at 21.5 and hematocrit initially at 61.5, these have come down since being given 3 L of fluid.  Anion gap of 20 and elevated total bili 3.1.  Poor renal function with a BUN of 25 and creatinine of 2.68, suggesting acute kidney injury.  Ethanol levels negative.  Initial glucose of 170.  BISAP score of 2.  Ranson criteria of 2 on admission.  I ordered imaging studies including chest x-ray, abdominal ultrasound, and abdominal CT.  I independently visualized and interpreted imaging which the chest x-ray showed no evidence of atelectasis or acute cardiopulmonary pathology.  The ultrasound was suggestive for acute pancreatic pathology.  The abdominal CT indicated severe pancreatitis with small volume abdominopelvic  ascites with hepatic steatosis and an incidental finding of horseshoe kidney. I agree with the radiologist interpretation  Intervention: I ordered medication including fluids, antiemetic, oxygen, and pain medication for fluid replenishment, nausea relief, dyspnea, and pain relief.  Reevaluation of the patient after these medicines showed that the patient had mildly improved vitals and appearance, in addition to pain relief.  I have reviewed the patients home medicines and have made adjustments as needed  Critical interventions include shock management  The patient was maintained on a cardiac monitor.  I personally viewed and interpreted the cardiac monitored which showed an underlying rhythm of: Sinus tachycardia  I requested consultation with the [facility] hospitalist,  and discussed lab and imaging findings as well as pertinent plan - they recommend: Inpatient admission for further treatment  ED Course: Patient had been nausea and vomiting over the last 24 hours.  Was tachycardic and tachypneic with oxygen saturation around 94 to 96% on arrival.  WBC not elevated and patient afebrile.  Immediately began providing fluids, antinausea, and pain medication.  Patient's systolic blood pressure dropped below 80 initially and improved after aggressive fluid resuscitation was initiated.  Patient met shock criteria and sepsis criteria.  Elevated lactate of 4.5 and concentrated H&H values, though unlikely sepsis and more likely due to dehydration.  Patient has a long history of alcohol abuse and has not consumed alcohol for at least a few days as well, therefore Ativan was provided.  Considered other acute abdominal pathology such as appendicitis or bowel obstruction, but imaging and physical exam not strongly supportive of this.  Considered GERD, but history and physical exam not supportive of this.  Severe pancreatitis suggested with ultrasound imaging and confirmed with CT scan.  Patient has history of acute  pancreatitis and is a chronic alcohol user.  Liver also appears enlarged on imaging, and elevated LFTs noted in labs.  Believe it is best the patient is admitted for further treatment and monitoring for severe pancreatitis.  Social Determinants of Health include tobacco use  Disposition: I discussed the patient and their case with my attending, Dr. Alvino Chapel, who agreed with the proposed treatment course.  After consideration of the diagnostic results and the patient's response to treatment, I feel that the patent would benefit from inpatient admission for further treatment and monitoring.  Discussed course of treatment thoroughly with the patient and he demonstrated understanding.  Patient in agreement and has no further questions.         Final Clinical Impression(s) / ED Diagnoses Final diagnoses:  Pain of upper abdomen  Nausea and vomiting, unspecified vomiting type    Rx / DC Orders ED Discharge Orders     None         Candace Cruise 42/39/53 1514    Davonna Belling, MD 10/25/21 2105

## 2021-10-25 NOTE — Plan of Care (Signed)

## 2021-10-25 NOTE — H&P (Addendum)
History and Physical    Stephen Bender ZOX:[id number] DOB: 1975/12/27 DOA: 10/25/2021  PCP: Pcp, No (Confirm with patient/family/NH records and if not entered, this has to be entered at [facility] point of entry) Patient coming from: Home  I have personally briefly reviewed patient's old medical records in [facility]  Chief Complaint: Belly hurts, feeling nausea and vomiting  HPI: Stephen Bender is a 46 y.o. male with medical history significant of alcohol abuse, recurrent alcoholic pancreatitis, came with new onset of epigastric pain, feeling nauseous and vomiting.  Patient was hospitalized in May 2022 for alcoholic pancreatitis underwent MRCP which showed no CBD etiology.  Patient was treated and stabilized and discharged home and since then patient has been staying away from alcohol until 1 week ago, patient picked up drinking again.  2-3 beers a day and last drink was Thursday night.  Monday morning, patient started to feel epigastric pain, cramping-like, radiating to the back, along with severe nauseous vomiting of stomach content, he has not been able to eat or drink since yesterday morning.  Denies any fever chills no diarrhea.  ED Course: Patient was found to have hypotensive and tachycardia, afebrile.  Blood pressure responded to IV bolus total of 2000 mL.  CT abdomen pelvis showed severe pancreatitis.  Small volume of abdominal pelvic ascites and small volume of bilateral pleural effusions.  Hepatic steatosis and hepatomegaly.  Blood work showed AKI creatinine 2.6 BUN 25, bicarb 16, K4.8, sodium 129.  Hemoconcentration of hemoglobin 21.5> 19.9.  WBC 7.3.  AST 334, ALT 161, total bili 3.1.  Lactic acid 4.5.  Review of Systems: As per HPI otherwise 14 point review of systems negative.    Past Medical History:  Diagnosis Date   ETOH abuse    Pancreatitis     History reviewed. No pertinent surgical history.   reports that he has been smoking cigarettes. He has been smoking an average  of 1 pack per day. He has never used smokeless tobacco. He reports current alcohol use. He reports that he does not use drugs.  No Known Allergies  History reviewed. No pertinent family history.   Prior to Admission medications   Medication Sig Start Date End Date Taking? Authorizing Provider  pantoprazole (PROTONIX) 40 MG tablet Take 1 tablet (40 mg total) by mouth daily. 12/31/20 12/31/21  Erick Blinks, MD    Physical Exam: Vitals:   10/25/21 1145 10/25/21 1200 10/25/21 1215 10/25/21 1245  BP: 99/71 101/78 103/75 102/79  Pulse: (!) 120 (!) 112 (!) 110 (!) 113  Resp: 16 17 (!) 21 (!) 24  Temp:      TempSrc:      SpO2: 93% 94% 95% 96%  Weight:      Height:        Constitutional: NAD, calm, comfortable Vitals:   10/25/21 1145 10/25/21 1200 10/25/21 1215 10/25/21 1245  BP: 99/71 101/78 103/75 102/79  Pulse: (!) 120 (!) 112 (!) 110 (!) 113  Resp: 16 17 (!) 21 (!) 24  Temp:      TempSrc:      SpO2: 93% 94% 95% 96%  Weight:      Height:       Eyes: PERRL, lids and conjunctivae normal ENMT: Mucous membranes are dry. Posterior pharynx clear of any exudate or lesions.Normal dentition.  Neck: normal, supple, no masses, no thyromegaly Respiratory: clear to auscultation bilaterally, no wheezing, no crackles. Normal respiratory effort. No accessory muscle use.  Cardiovascular: Regular rate and rhythm, no murmurs /  rubs / gallops. No extremity edema. 2+ pedal pulses. No carotid bruits.  Abdomen: severe tenderness on epigastric area, no rebound no guarding, no masses palpated. No hepatosplenomegaly. Bowel sounds positive.  Musculoskeletal: no clubbing / cyanosis. No joint deformity upper and lower extremities. Good ROM, no contractures. Normal muscle tone.  Skin: no rashes, lesions, ulcers. No induration Neurologic: CN 2-12 grossly intact. Sensation intact, DTR normal. Strength 5/5 in all 4.  Fine tremors on bilateral forearms and fingers Psychiatric: Normal judgment and insight.  Alert and oriented x 3. Normal mood.     Labs on Admission: I have personally reviewed following labs and imaging studies  CBC: Recent Labs  Lab 10/25/21 0912 10/25/21 1023  WBC 7.3 7.8  NEUTROABS 6.6  --   HGB 21.5* 19.9*  HCT 61.4* 58.3*  MCV 100.5* 100.9*  PLT 63* 57*   Basic Metabolic Panel: Recent Labs  Lab 10/25/21 0912  NA 129*  K 4.8  CL 93*  CO2 16*  GLUCOSE 170*  BUN 25*  CREATININE 2.68*  CALCIUM 7.8*   GFR: Estimated Creatinine Clearance: 36.1 mL/min (A) (by C-G formula based on SCr of 2.68 mg/dL (H)). Liver Function Tests: Recent Labs  Lab 10/25/21 0912  AST 334*  ALT 161*  ALKPHOS 113  BILITOT 3.1*  PROT 6.9  ALBUMIN 3.5   Recent Labs  Lab 10/25/21 0912  LIPASE 897*   No results for input(s): AMMONIA in the last 168 hours. Coagulation Profile: No results for input(s): INR, PROTIME in the last 168 hours. Cardiac Enzymes: No results for input(s): CKTOTAL, CKMB, CKMBINDEX, TROPONINI in the last 168 hours. BNP (last 3 results) No results for input(s): PROBNP in the last 8760 hours. HbA1C: No results for input(s): HGBA1C in the last 72 hours. CBG: No results for input(s): GLUCAP in the last 168 hours. Lipid Profile: No results for input(s): CHOL, HDL, LDLCALC, TRIG, CHOLHDL, LDLDIRECT in the last 72 hours. Thyroid Function Tests: No results for input(s): TSH, T4TOTAL, FREET4, T3FREE, THYROIDAB in the last 72 hours. Anemia Panel: No results for input(s): VITAMINB12, FOLATE, FERRITIN, TIBC, IRON, RETICCTPCT in the last 72 hours. Urine analysis:    Component Value Date/Time   COLORURINE AMBER (A) 10/25/2021 0759   APPEARANCEUR HAZY (A) 10/25/2021 0759   LABSPEC 1.025 10/25/2021 0759   PHURINE 5.0 10/25/2021 0759   GLUCOSEU NEGATIVE 10/25/2021 0759   HGBUR NEGATIVE 10/25/2021 0759   BILIRUBINUR NEGATIVE 10/25/2021 0759   KETONESUR 5 (A) 10/25/2021 0759   PROTEINUR 30 (A) 10/25/2021 0759   NITRITE NEGATIVE 10/25/2021 0759    LEUKOCYTESUR NEGATIVE 10/25/2021 0759    Radiological Exams on Admission: CT ABDOMEN PELVIS WO CONTRAST  Result Date: 10/25/2021 CLINICAL DATA:  Acute, severe pancreatitis. Alcohol abuse. Thrombocytopenia. EXAM: CT ABDOMEN AND PELVIS WITHOUT CONTRAST TECHNIQUE: Multidetector CT imaging of the abdomen and pelvis was performed following the standard protocol without IV contrast. RADIATION DOSE REDUCTION: This exam was performed according to the departmental dose-optimization program which includes automated exposure control, adjustment of the mA and/or kV according to patient size and/or use of iterative reconstruction technique. COMPARISON:  Ultrasound earlier today. Abdominal MRI 12/30/2020. CT 11/13/2016 FINDINGS: Lower chest: Clear lung bases. Normal heart size without pericardial or pleural effusion. Hepatobiliary: Marked hepatic steatosis, with mild hepatomegaly at 18.1 cm craniocaudal. No calcified gallstones.  No biliary duct dilatation. Pancreas: Marked peripancreatic edema with fluid throughout the anterior pararenal space, extending into the omentum and along the paracolic gutters. Limited evaluation for more well defined fluid collection, given  lack of oral or IV contrast. Spleen: Normal in size, without focal abnormality. Adrenals/Urinary Tract: Normal adrenal glands. Horseshoe kidney, without hydronephrosis or bladder stone. Stomach/Bowel: The antro pyloric region is displaced anteriorly by the peripancreatic fluid. No gastric outlet obstruction. Otherwise normal small bowel. Normal terminal ileum and appendix. Vascular/Lymphatic: Normal caliber of the aorta and branch vessels. No abdominopelvic adenopathy. Reproductive: Normal prostate. Other: Small volume abdominopelvic ascites. Musculoskeletal: No acute osseous abnormality. IMPRESSION: 1. Severe pancreatitis. Limited evaluation for focal fluid collection given lack of IV contrast. 2. Small volume abdominopelvic ascites. 3. Hepatic steatosis and  hepatomegaly. 4. Horseshoe kidney. Electronically Signed   By: Jeronimo Greaves M.D.   On: 10/25/2021 11:36   US Abdomen Complete  Result Date: 10/25/2021 CLINICAL DATA:  Abdominal pain and vomiting for several days. Acute pancreatitis. EXAM: ABDOMEN ULTRASOUND COMPLETE COMPARISON:  12/30/2020 FINDINGS: Gallbladder: No gallstones or wall thickening visualized. No sonographic Murphy sign noted by sonographer. Common bile duct: Diameter: 5 mm, within normal limits. Liver: Diffusely increased echogenicity of the hepatic parenchyma, consistent with hepatic steatosis. No hepatic mass identified. Portal vein is patent on color Doppler imaging with normal direction of blood flow towards the liver. IVC: No abnormality visualized. Pancreas: Visualized portion of the pancreatic head and body appears prominent with mild peripancreatic fluid, highly suspicious for acute pancreatitis. Spleen: Size and appearance within normal limits. Right Kidney: Length: 8.8 cm. Echogenicity within normal limits. No mass or hydronephrosis visualized. Left Kidney: Length: 8.4 cm. Limited visualization due to patient habitus and immobility. Echogenicity within normal limits. No mass or hydronephrosis visualized. Abdominal aorta: No aneurysm visualized. Other findings: Small amount of free fluid seen in Morison's pouch and the left upper quadrant. IMPRESSION: Probable acute pancreatitis, with small amount of peripancreatic and bilateral upper quadrant fluid. No evidence of gallstones or biliary ductal dilatation. Hepatic steatosis. Electronically Signed   By: Danae Orleans M.D.   On: 10/25/2021 11:07   DG Chest Port 1 View  Result Date: 10/25/2021 CLINICAL DATA:  46 year old male with history of orthopnea. EXAM: PORTABLE CHEST 1 VIEW COMPARISON:  No priors. FINDINGS: Lung volumes are normal. No consolidative airspace disease. No pleural effusions. No pneumothorax. No pulmonary nodule or mass noted. Pulmonary vasculature and the  cardiomediastinal silhouette are within normal limits. IMPRESSION: No radiographic evidence of acute cardiopulmonary disease. Electronically Signed   By: Trudie Reed M.D.   On: 10/25/2021 11:02    EKG: Independently reviewed. Sinus Tachy  Assessment/Plan Principal Problem:   Pancreatitis Active Problems:   Acute pancreatitis  (please populate well all problems here in Problem List. (For example, if patient is on BP meds at home and you resume or decide to hold them, it is a problem that needs to be her. Same for CAD, COPD, HLD and so on)  Acute alcoholic pancreatitis -BISAP=3, which considered to be severe, will admit to PCU for close monitoring. -NPO -Morphine for pain -CT abd reviewed, no CBD etiology, and given the correlation of recurrent alcohol abuse and pancreatitis, will not repeat MRCP this time.  But will check lipid panel.  Acute probably on chronic alcoholic hepatitis, with bilirubinemia and worsening of transaminitis -Check INR to calculate Maddrey's discriminant function -Hold off steroid  Alcohol abuse with acute withdrawal -Tremors on fingertips and tongue, start CIWA protocol.  Start banana bag for hydration today.  SIRS -Secondary to acute pancreatitis and alcohol withdrawal, no fever no leukocytosis, will monitor off antibiotics for now.  AKI -Secondary to severe dehydration from pancreatitis -On continuous IV  fluid.  Non-anion gap and anion gap metabolic acidosis -From severe dehydration AKI and pancreatitis -Correct volume status then reevaluate tomorrow.  Hyponatremia -Hypovolemic secondary to repeated vomiting and dehydration from acute pancreatitis, correct volume first and then reevaluate sodium level.  Hypocalcemia -IV replacement, might be related to pancreatitis too.  Elevated glucose level -No history of diabetes, check A1c -Sliding scale for now.  DVT prophylaxis: SCD Code Status: Full code Family Communication: Mother at  bedside Disposition Plan: Patient is sick with severe pancreatitis and alcohol withdrawal, expect more than 2 midnight hospital stay. Consults called: None Admission status: PCU   Emeline General MD [facility] Pager [phone]  10/25/2021, 12:52 PM

## 2021-10-25 NOTE — Discharge Instructions (Addendum)
                  Intensive Outpatient Programs  [facility]    [facility] [address]     [address] [place],  [place]     [place], [place] [phone]      [phone]  [facility]   [facility]  (Inpatient and outpatient)  [phone] (Suboxone and Methadone) [address]           [phone]           [facility]    [facility] - Intensive Outpatient [address]     [address] [place], [place] [address]     [place], [place]  [phone]      [phone]  [facility] (Outpatient, Inpatient, Chemical  [facility] (Groups and [facility]idental) (insurance only) [phone]    [place], [place]          [phone]       [facility]    [facility] (for caregivers and family) [address]     [address] [place], [place]     [place], [place] [phone]      [phone]  Residential Treatment Programs  [facility]  [facility](2 years) Residential: 90 days)  [facility] [address]      [address] [place], [place]     [place], [place] [phone]      [phone] or [phone]  [facility]    [facility] [address]      [address] [place], [place]     , [place] [phone]      [phone]  [facility]   [facility] ([facility] [address]     [address] [place], [place] [address]     [place], [place] [phone]      [phone] Admissions: 8am-3pm M-F  [facility]: Residential Program (90 Days)              [facility]: [facility]  [place], [place]     [place], [place]  [phone] or [phone]    (Walk in Hours over the weekend or by referral)   [facility]: [facility]:[phone] (for crisis  response 24 hours a day) 

## 2021-10-25 NOTE — TOC Initial Note (Addendum)
Transition of Care (TOC) - Initial/Assessment Note  ? ? ?Patient Details  ?Name: Stephen Bender ?MRN: [id number] ?Date of Birth: Jan 07, 1976 ? ?Transition of Care (TOC) CM/SW Contact:    ?Verdell Carmine, RN ?Phone Number: ?10/25/2021, 12:54 PM ? ?Clinical Narrative:                 ?Presented with abdominal pain, severe pancreatitis. Last drink Thursday. Referral to [facility] to set up Primary care, patient uninsured. May need MATCH assistance for medications. Is employed as a Location manager. SA resources added to patient instructions. ?CM will follow for needs, recommendations, and transitions. ? ?Expected Discharge Plan: Home/Self Care ?Barriers to Discharge: Continued Medical Work up ? ? ?Patient Goals and CMS Choice ?  ?  ?  ? ?Expected Discharge Plan and Services ?Expected Discharge Plan: Home/Self Care ?  ?  ?  ?Living arrangements for the past 2 months: [place] ?                ?  ?  ?  ?  ?  ?  ?  ?  ?  ?  ? ?Prior Living Arrangements/Services ?Living arrangements for the past 2 months: [place] ?Lives with:: Spouse ?Patient language and need for interpreter reviewed:: Yes ?       ?Need for Family Participation in Patient Care: Yes (Comment) ?Care giver support system in place?: Yes (comment) ?  ?Criminal Activity/Legal Involvement Pertinent to Current Situation/Hospitalization: No - Comment as needed ? ?Activities of Daily Living ?  ?  ? ?Permission Sought/Granted ?  ?  ?   ?   ?   ?   ? ?Emotional Assessment ?  ?  ?  ?Orientation: : Oriented to Self, Oriented to Place, Oriented to  Time, Oriented to Situation ?Alcohol / Substance Use: Alcohol Use ?Psych Involvement: No (comment) ? ?Admission diagnosis:  Pancreatitis [K85.90] ?Patient Active Problem List  ? Diagnosis Date Noted  ? Pancreatitis 10/25/2021  ? Alcohol abuse 12/31/2020  ? Thrombocytopenia ([place]) 12/31/2020  ? Tobacco use 12/31/2020  ? Acute pancreatitis 12/30/2020  ? ?PCP:  Pcp, No ?Pharmacy:   ?[id number] [place] -  [address] ?[address] ?[place] [address] ?Phone: [phone] Fax: [phone] ? ? ? ? ?Social Determinants of Health (SDOH) Interventions ?  ? ?Readmission Risk Interventions ?No flowsheet data found. ? ? ?

## 2021-10-25 NOTE — Progress Notes (Addendum)
INR normal, calculated Discriminant factor=3, no indication for steroid. ? ?Triglyceride>900, discussed with [facility] Dr. Marca Ancona, will start insulin drip and change IVF to D5NS at 150 ml/HR. Patient and family updated about the finding and treatment plan. ? ?D/W pharmacy, IV Vitamin in shortage, patient tolerated PO, will give PO Thiamine. ?

## 2021-10-25 NOTE — ED Triage Notes (Signed)
Pt states hx of pancreatitis.  Abd pain and vomiting since Friday morning.  Last etoh on Thursday. ?

## 2021-10-26 DIAGNOSIS — E781 Pure hyperglyceridemia: Secondary | ICD-10-CM

## 2021-10-26 DIAGNOSIS — D696 Thrombocytopenia, unspecified: Secondary | ICD-10-CM

## 2021-10-26 DIAGNOSIS — K858 Other acute pancreatitis without necrosis or infection: Secondary | ICD-10-CM

## 2021-10-26 DIAGNOSIS — R7989 Other specified abnormal findings of blood chemistry: Secondary | ICD-10-CM

## 2021-10-26 DIAGNOSIS — R739 Hyperglycemia, unspecified: Secondary | ICD-10-CM

## 2021-10-26 DIAGNOSIS — F10931 Alcohol use, unspecified with withdrawal delirium: Secondary | ICD-10-CM

## 2021-10-26 DIAGNOSIS — E871 Hypo-osmolality and hyponatremia: Secondary | ICD-10-CM

## 2021-10-26 DIAGNOSIS — N179 Acute kidney failure, unspecified: Secondary | ICD-10-CM

## 2021-10-26 DIAGNOSIS — E872 Acidosis, unspecified: Secondary | ICD-10-CM

## 2021-10-26 LAB — GLUCOSE, CAPILLARY
Glucose-Capillary: 114 mg/dL — ABNORMAL HIGH (ref 70–99)
Glucose-Capillary: 131 mg/dL — ABNORMAL HIGH (ref 70–99)
Glucose-Capillary: 136 mg/dL — ABNORMAL HIGH (ref 70–99)
Glucose-Capillary: 137 mg/dL — ABNORMAL HIGH (ref 70–99)
Glucose-Capillary: 143 mg/dL — ABNORMAL HIGH (ref 70–99)
Glucose-Capillary: 146 mg/dL — ABNORMAL HIGH (ref 70–99)
Glucose-Capillary: 152 mg/dL — ABNORMAL HIGH (ref 70–99)
Glucose-Capillary: 164 mg/dL — ABNORMAL HIGH (ref 70–99)
Glucose-Capillary: 166 mg/dL — ABNORMAL HIGH (ref 70–99)
Glucose-Capillary: 173 mg/dL — ABNORMAL HIGH (ref 70–99)
Glucose-Capillary: 177 mg/dL — ABNORMAL HIGH (ref 70–99)
Glucose-Capillary: 178 mg/dL — ABNORMAL HIGH (ref 70–99)
Glucose-Capillary: 178 mg/dL — ABNORMAL HIGH (ref 70–99)
Glucose-Capillary: 181 mg/dL — ABNORMAL HIGH (ref 70–99)
Glucose-Capillary: 185 mg/dL — ABNORMAL HIGH (ref 70–99)
Glucose-Capillary: 187 mg/dL — ABNORMAL HIGH (ref 70–99)

## 2021-10-26 LAB — CBC
HCT: 50.8 % (ref 39.0–52.0)
Hemoglobin: 17.1 g/dL — ABNORMAL HIGH (ref 13.0–17.0)
MCH: 33.5 pg (ref 26.0–34.0)
MCHC: 33.7 g/dL (ref 30.0–36.0)
MCV: 99.4 fL (ref 80.0–100.0)
Platelets: 35 10*3/uL — ABNORMAL LOW (ref 150–400)
RBC: 5.11 MIL/uL (ref 4.22–5.81)
RDW: 13.8 % (ref 11.5–15.5)
WBC: 6.7 10*3/uL (ref 4.0–10.5)
nRBC: 0 % (ref 0.0–0.2)

## 2021-10-26 LAB — LIPID PANEL
Cholesterol: 131 mg/dL (ref 0–200)
HDL: <10 mg/dL — ABNORMAL LOW (ref >40)
Triglycerides: 634 mg/dL — ABNORMAL HIGH (ref <150)
VLDL: UNDETERMINED mg/dL (ref 0–40)

## 2021-10-26 LAB — COMPREHENSIVE METABOLIC PANEL
ALT: 90 U/L — ABNORMAL HIGH (ref 0–44)
AST: 162 U/L — ABNORMAL HIGH (ref 15–41)
Albumin: 2.4 g/dL — ABNORMAL LOW (ref 3.5–5.0)
Alkaline Phosphatase: 78 U/L (ref 38–126)
Anion gap: 8 (ref 5–15)
BUN: 29 mg/dL — ABNORMAL HIGH (ref 6–20)
CO2: 21 mmol/L — ABNORMAL LOW (ref 22–32)
Calcium: 6.7 mg/dL — ABNORMAL LOW (ref 8.9–10.3)
Chloride: 100 mmol/L (ref 98–111)
Creatinine, Ser: 1.9 mg/dL — ABNORMAL HIGH (ref 0.61–1.24)
GFR, Estimated: 44 mL/min — ABNORMAL LOW (ref >60)
Glucose, Bld: 155 mg/dL — ABNORMAL HIGH (ref 70–99)
Potassium: 4.4 mmol/L (ref 3.5–5.1)
Sodium: 129 mmol/L — ABNORMAL LOW (ref 135–145)
Total Bilirubin: 1.7 mg/dL — ABNORMAL HIGH (ref 0.3–1.2)
Total Protein: 5.3 g/dL — ABNORMAL LOW (ref 6.5–8.1)

## 2021-10-26 LAB — LACTIC ACID, PLASMA: Lactic Acid, Venous: 1.3 mmol/L (ref 0.5–1.9)

## 2021-10-26 LAB — LIPASE, BLOOD: Lipase: 337 U/L — ABNORMAL HIGH (ref 11–51)

## 2021-10-26 MED ORDER — CHLORDIAZEPOXIDE HCL 10 MG PO CAPS: 10.0000 mg | ORAL_CAPSULE | Freq: Four times a day (QID) | ORAL | Status: DC | PRN

## 2021-10-26 MED ORDER — CHLORDIAZEPOXIDE HCL 25 MG PO CAPS
50.0000 mg | ORAL_CAPSULE | Freq: Once | ORAL | Status: AC
  Administered 2021-10-26: 50 mg via ORAL
  Filled 2021-10-26: qty 2

## 2021-10-26 MED ORDER — ADULT MULTIVITAMIN W/MINERALS CH
1.0000 | ORAL_TABLET | Freq: Every day | ORAL | Status: DC
  Administered 2021-10-27 – 2021-10-29 (×3): 1 via ORAL
  Filled 2021-10-26 (×3): qty 1

## 2021-10-26 MED ORDER — FOLIC ACID 1 MG PO TABS
1.0000 mg | ORAL_TABLET | Freq: Every day | ORAL | Status: DC
  Administered 2021-10-26 – 2021-10-29 (×4): 1 mg via ORAL
  Filled 2021-10-26 (×4): qty 1

## 2021-10-26 MED ORDER — SODIUM CHLORIDE 0.9 % IV BOLUS
1000.0000 mL | Freq: Once | INTRAVENOUS | Status: AC
  Administered 2021-10-26: 1000 mL via INTRAVENOUS

## 2021-10-26 MED ORDER — LORAZEPAM 2 MG/ML IJ SOLN
1.0000 mg | INTRAMUSCULAR | Status: AC | PRN
  Administered 2021-10-26: 4 mg via INTRAVENOUS
  Administered 2021-10-26: 2 mg via INTRAVENOUS
  Administered 2021-10-26: 4 mg via INTRAVENOUS
  Administered 2021-10-27: 2 mg via INTRAVENOUS
  Filled 2021-10-26: qty 1
  Filled 2021-10-26 (×2): qty 2

## 2021-10-26 MED ORDER — LOPERAMIDE HCL 2 MG PO CAPS
2.0000 mg | ORAL_CAPSULE | ORAL | Status: DC | PRN
  Administered 2021-10-28 (×2): 2 mg via ORAL
  Filled 2021-10-26 (×2): qty 1

## 2021-10-26 MED ORDER — ADULT MULTIVITAMIN LIQUID CH
15.0000 mL | Freq: Every day | ORAL | Status: DC
  Administered 2021-10-26: 15 mL via ORAL
  Filled 2021-10-26: qty 15

## 2021-10-26 MED ORDER — CHLORDIAZEPOXIDE HCL 25 MG PO CAPS
25.0000 mg | ORAL_CAPSULE | Freq: Three times a day (TID) | ORAL | Status: AC
  Administered 2021-10-27 – 2021-10-28 (×3): 25 mg via ORAL
  Filled 2021-10-26 (×3): qty 1

## 2021-10-26 MED ORDER — CHLORDIAZEPOXIDE HCL 25 MG PO CAPS
25.0000 mg | ORAL_CAPSULE | Freq: Three times a day (TID) | ORAL | Status: AC
  Administered 2021-10-27 (×3): 25 mg via ORAL
  Filled 2021-10-26 (×4): qty 1

## 2021-10-26 MED ORDER — CALCIUM GLUCONATE-NACL 1-0.675 GM/50ML-% IV SOLN
1.0000 g | Freq: Once | INTRAVENOUS | Status: AC
  Administered 2021-10-26: 1000 mg via INTRAVENOUS
  Filled 2021-10-26 (×2): qty 50

## 2021-10-26 MED ORDER — LORAZEPAM 1 MG PO TABS
1.0000 mg | ORAL_TABLET | ORAL | Status: AC | PRN
  Administered 2021-10-27: 2 mg via ORAL
  Filled 2021-10-26: qty 2

## 2021-10-26 MED ORDER — HYDROXYZINE HCL 25 MG PO TABS
25.0000 mg | ORAL_TABLET | Freq: Four times a day (QID) | ORAL | Status: DC | PRN
  Administered 2021-10-27: 25 mg via ORAL
  Filled 2021-10-26: qty 1

## 2021-10-26 MED ORDER — CHLORDIAZEPOXIDE HCL 25 MG PO CAPS
25.0000 mg | ORAL_CAPSULE | Freq: Four times a day (QID) | ORAL | Status: DC | PRN
  Administered 2021-10-26 – 2021-10-27 (×2): 25 mg via ORAL
  Filled 2021-10-26: qty 1

## 2021-10-26 MED ORDER — LORAZEPAM 2 MG/ML IJ SOLN: 0.0000 mg | Freq: Three times a day (TID) | INTRAMUSCULAR | Status: DC

## 2021-10-26 MED ORDER — ONDANSETRON 4 MG PO TBDP: 4.0000 mg | ORAL_TABLET | Freq: Four times a day (QID) | ORAL | Status: DC | PRN

## 2021-10-26 MED ORDER — LORAZEPAM 2 MG/ML IJ SOLN
0.0000 mg | INTRAMUSCULAR | Status: AC
  Administered 2021-10-26 (×3): 2 mg via INTRAVENOUS
  Administered 2021-10-27 (×2): 4 mg via INTRAVENOUS
  Administered 2021-10-28: 2 mg via INTRAVENOUS
  Filled 2021-10-26: qty 1
  Filled 2021-10-26: qty 2
  Filled 2021-10-26: qty 1
  Filled 2021-10-26 (×2): qty 2
  Filled 2021-10-26 (×2): qty 1

## 2021-10-26 MED ORDER — CHLORDIAZEPOXIDE HCL 25 MG PO CAPS
25.0000 mg | ORAL_CAPSULE | ORAL | Status: AC
  Administered 2021-10-28 – 2021-10-29 (×2): 25 mg via ORAL
  Filled 2021-10-26 (×2): qty 1

## 2021-10-26 MED ORDER — CHLORDIAZEPOXIDE HCL 25 MG PO CAPS: 25.0000 mg | ORAL_CAPSULE | Freq: Every day | ORAL | Status: DC

## 2021-10-26 NOTE — Progress Notes (Signed)
Patient more confused than this morning. He is pulling at lines, tremors are more noticeable and HR increased in the 140's. Ativan given per CIWA protocol, MD paged and made aware. New CIWA orders placed. Patient will likely need a sitter if family leaves the bedside, will continue to monitor. ?

## 2021-10-26 NOTE — Significant Event (Signed)
Rapid Response Event Note  ? ?Reason for Call :  ?Agitation and withdrawal symptoms ? ?Initial Focused Assessment:  ?Patient is agitated in the bed, trying to get up despite redirection from staff. Family is also at the bedside trying to get him to cooperate but is unsuccessful. Lung sounds are diminished, skin is warm and clammy.  ? ?Attempted deescalating but ultimately required restraints to get patient from harming himself and others.  ? ?VS: HR 176, BP 123/95, RR 36, Sats 84% ? ?Interventions:  ?PRN ativan given per [facility] orders ?Restraints applied for safety ? ?Plan of Care:  ?Continue to give CIWA medications per [facility] ?Dr. Bruna Potter ordered PRN librium ?Maintain PIV access ?Please notify [facility] if further assistance is needed ? ?Event Summary:  ? ?MD Notified: Dr. Bruna Potter ?Call Time: 2226 ?Arrival Time: 2230 ?End Time: 2315 ? ?Ronne Binning, RN ?

## 2021-10-26 NOTE — Assessment & Plan Note (Addendum)
-   Patient's calcium was 7.6 this AM  -Was given a dose of calcium gluconate 1 g again yesterday

## 2021-10-26 NOTE — Assessment & Plan Note (Addendum)
-  Likely reactive and will continue monitor and repeat hemoglobin A1c in the morning to evaluate for diabetes and he does not have any and it was 5.5 ?-Insulin gtt stopped  ?-If necessary will place on sensitive NovoLog/scale insulin AC ?

## 2021-10-26 NOTE — Hospital Course (Addendum)
HPI per Dr. Mikey College Genevieve Ritzel is a 46 y.o. male with medical history significant of alcohol abuse, recurrent alcoholic pancreatitis, came with new onset of epigastric pain, feeling nauseous and vomiting.   Patient was hospitalized in May 2022 for alcoholic pancreatitis underwent MRCP which showed no CBD etiology.  Patient was treated and stabilized and discharged home and since then patient has been staying away from alcohol until 1 week ago, patient picked up drinking again.  2-3 beers a day and last drink was Thursday night.  Monday morning, patient started to feel epigastric pain, cramping-like, radiating to the back, along with severe nauseous vomiting of stomach content, he has not been able to eat or drink since yesterday morning.  Denies any fever chills no diarrhea.   ED Course: Patient was found to have hypotensive and tachycardia, afebrile.   Blood pressure responded to IV bolus total of 2000 mL.  CT abdomen pelvis showed severe pancreatitis.  Small volume of abdominal pelvic ascites and small volume of bilateral pleural effusions.  Hepatic steatosis and hepatomegaly.   Blood work showed AKI creatinine 2.6 BUN 25, bicarb 16, K4.8, sodium 129.  Hemoconcentration of hemoglobin 21.5> 19.9.  WBC 7.3.  AST 334, ALT 161, total bili 3.1.  Lactic acid 4.5.   **Interim History Patient's Abdominal Pain is improving and TG and Lipase Level trending down. GI consulted and recommending continuing Fluid. He is starting to withdraw more so will escalate CIWA protocol to SDU monitoring and a Librium Taper was placed. LFTS, Renal Fxn and T Bili improving along with TG. LA resolved.   Since his lipid profile is improved we will stop his insulin drip and his CBG monitoring.  We will continue IV fluid hydration he was given additional 1 L bolus today. Will continue to Monitor and reduce IVF to 100 mL/hr x 1 day.

## 2021-10-26 NOTE — Assessment & Plan Note (Signed)
-  Patient's LA went from 4.5 -> 1.3 and resolved ?-Continue to Monitor and Trend ?

## 2021-10-26 NOTE — Assessment & Plan Note (Addendum)
-   In the setting of his alcoholism and splenic sequestration; patient's platelet count has dropped from admission is now 35 -> 33 -> 46 we will need to continue monitor for signs and symptoms of bleeding; currently no overt bleeding noted

## 2021-10-26 NOTE — Assessment & Plan Note (Addendum)
-  Na+ is now gone from 129 -> 128 -> 129 -> 132 x2 -Continue with IV fluid hydration as delineated -Continue to monitor and trend and repeat CMP in the a.m.

## 2021-10-26 NOTE — Progress Notes (Signed)
PROGRESS NOTE    Stephen Bender  ZOX:[id number] DOB: 1976/06/03 DOA: 10/25/2021 PCP: Pcp, No   Brief Narrative:  HPI per Dr. Mikey College Stephen Bender is a 46 y.o. male with medical history significant of alcohol abuse, recurrent alcoholic pancreatitis, came with new onset of epigastric pain, feeling nauseous and vomiting.   Patient was hospitalized in May 2022 for alcoholic pancreatitis underwent MRCP which showed no CBD etiology.  Patient was treated and stabilized and discharged home and since then patient has been staying away from alcohol until 1 week ago, patient picked up drinking again.  2-3 beers a day and last drink was Thursday night.  Monday morning, patient started to feel epigastric pain, cramping-like, radiating to the back, along with severe nauseous vomiting of stomach content, he has not been able to eat or drink since yesterday morning.  Denies any fever chills no diarrhea.   ED Course: Patient was found to have hypotensive and tachycardia, afebrile.   Blood pressure responded to IV bolus total of 2000 mL.  CT abdomen pelvis showed severe pancreatitis.  Small volume of abdominal pelvic ascites and small volume of bilateral pleural effusions.  Hepatic steatosis and hepatomegaly.   Blood work showed AKI creatinine 2.6 BUN 25, bicarb 16, K4.8, sodium 129.  Hemoconcentration of hemoglobin 21.5> 19.9.  WBC 7.3.  AST 334, ALT 161, total bili 3.1.  Lactic acid 4.5.   **Interim History Patient's Abdominal Pain is improving and TG and Lipase Level trending down. GI consulted and recommending continuing Fluid. He is starting to withdraw more so will escalate CIWA protocol to SDU monitoring. LFTS, Renal Fxn and T Bili improving along with TG. LA resolved.     Assessment and Plan: * Pancreatitis -Likely Multifactorial as Patient has Alcohol Abuse Hx and because his TG were >900 -BISAP=3, which considered to be severe, will admit to PCU for close monitoring. -NPO but now on CLD per  GI -Morphine for pain -CT abd reviewed, no CBD etiology, and given the correlation of recurrent alcohol abuse and pancreatitis, will not repeat MRCP this time.   -Lipid Panel checked and showed evaded triglycerides on admission at 915 is now trended on 634 -Given another dose of IV fluid boluses of normal saline 1 L and will continue D5 normal saline at 100 MLS per hour for now -GI consulted and appreciate further recommendations and they recommend continue aggressive IV fluid resuscitation with D5 normal saline at 150 cc/h and recommend watching for alcohol withdrawal and continue pain control -Lipase level is trending down we will repeat in the morning  Lactic acidosis -Patient's LA went from 4.5 -> 1.3 and resolved -Continue to Monitor and Trend  Abnormal LFTs Hyperbilirubinemia  - In the setting of alcoholic pancreatitis.  Patient's AST is now trended down and went from 334 and is now 162 and patient's ALT is now trended down from 161 is now 90 -Does not qualify for prednisolone based on moderate discriminant score -Bilirubin is trending down as well and 3.1 -> 1.7 -Continue to monitor Hepatic Fxn Panel and may obtain RUQ U/S   Hyponatremia -Na+ is now gone from 129 -> 128 -> 129 -Continue with IV fluid hydration as delineated -Continue to monitor and trend and repeat CMP in the a.m.  Alcohol withdrawal (HCC) - Increased to the stepdown unit CIWA monitoring protocol and will give scheduled lorazepam -Continue folic acid, thiamine, multivitamin and will consider given his increased agitation and hallucinations -If continues to worsen may need Librium  Hyperglycemia -  Likely reactive and will continue monitor and repeat hemoglobin A1c in the morning to evaluate for diabetes -If necessary will place on sensitive NovoLog/scale insulin AC  Hypocalcemia - Patient's calcium was 6.7 and corrected for his albumin was still low -We will give a dose of calcium gluconate 1 g again  AKI  (acute kidney injury) (HCC) Metabolic Acidosis -Patient's BUNs/creatinine on admission was 25/2.68 and is now trended down to 29/1.90; continue with D5 normal saline at 150 MLS per hour we will also give her 1 L normal saline bolus -Patient's CO2 is now 21, anion gap is 8, chloride level is now 100; much improved since admission -Continue to monitor and avoid nephrotoxic medications, contrast dyes, hypotension and and dehydration and renally adjust medications -Repeat CMP in the a.m.   Hypertriglyceridemia - Likely contributing to his acute pancreatitis and is trending down with last triglyceride level 634 -Repeat triglyceride level in the morning  Thrombocytopenia (HCC) - In the setting of his alcoholism and splenic sequestration; patient's platelet count has dropped from admission is now 35 we will need to continue monitor for signs and symptoms of bleeding; currently no overt bleeding noted  DVT prophylaxis: SCDs Start: 10/25/21 1248    Code Status: Full Code Family Communication: No family currently at bedside   Disposition Plan:  Level of care: Progressive Status is: Inpatient Remains inpatient appropriate because: Has severe Pancreatitis    Consultants:  Gastroenterology   Procedures:  None  Antimicrobials:  Anti-infectives (From admission, onward)    None       Subjective: Seen and examined at bedside and he was feeling okay states his abdomen is not hurting as bad.  Was little bit more tremulous today.  States that he is does not drink alcohol more.  No nausea or vomiting and able to tolerate clear liquid diet so far.  No other concerns or complaints this time.  Objective: Vitals:   10/26/21 1300 10/26/21 1400 10/26/21 1500 10/26/21 1605  BP:    (!) 124/91  Pulse: (!) 123 (!) 132 96 (!) 123  Resp: (!) 33 (!) Temp:      TempSrc:      SpO2: 95% 93% (!) 76% 94%  Weight:      Height:        Intake/Output Summary (Last 24 hours) at 10/26/2021  1741 Last data filed at 10/26/2021 1700 Gross per 24 hour  Intake 3138.28 ml  Output 716 ml  Net 2422.28 ml   Filed Weights   10/25/21 0816  Weight: 72.6 kg   Examination: Physical Exam:  Constitutional: Thin chronically ill-appearing Caucasian male currently no acute distress Respiratory: Diminished to auscultation bilaterally with coarse breath sounds, no wheezing, rales, rhonchi or crackles. Normal respiratory effort and patient is not tachypenic. No accessory muscle use.  Cardiovascular: RRR, no murmurs / rubs / gallops. S1 and S2 auscultated.  No appreciable extremity edema Abdomen: Soft, slightly-tender, non-distended. Bowel sounds positive.  GU: Deferred. Musculoskeletal: No clubbing / cyanosis of digits/nails. No joint deformity upper and lower extremities. Skin: No rashes, lesions, ulcers or has multiple tattoos diffusely scattered throughout his body. No induration; Warm and dry.  Neurologic: CN 2-12 grossly intact with no focal deficits but he is a little tremulous  Data Reviewed: I have personally reviewed following labs and imaging studies  CBC: Recent Labs  Lab 10/25/21 0912 10/25/21 1023 10/26/21 0028  WBC 7.3 7.8 6.7  NEUTROABS 6.6  --   --   HGB 21.5*  19.9* 17.1*  HCT 61.4* 58.3* 50.8  MCV 100.5* 100.9* 99.4  PLT 63* 57* 35*   Basic Metabolic Panel: Recent Labs  Lab 10/25/21 0912 10/25/21 1923 10/26/21 0028  NA 129* 128* 129*  K 4.8 5.1 4.4  CL 93* 99 100  CO2 16* 18* 21*  GLUCOSE 170* 147* 155*  BUN 25* 30* 29*  CREATININE 2.68* 2.28* 1.90*  CALCIUM 7.8* 6.6* 6.7*   GFR: Estimated Creatinine Clearance: 50.9 mL/min (A) (by C-G formula based on SCr of 1.9 mg/dL (H)). Liver Function Tests: Recent Labs  Lab 10/25/21 0912 10/26/21 0028  AST 334* 162*  ALT 161* 90*  ALKPHOS 113 78  BILITOT 3.1* 1.7*  PROT 6.9 5.3*  ALBUMIN 3.5 2.4*   Recent Labs  Lab 10/25/21 0912 10/26/21 0028  LIPASE 897* 337*   No results for input(s): AMMONIA in  the last 168 hours. Coagulation Profile: Recent Labs  Lab 10/25/21 1425  INR 0.9   Cardiac Enzymes: No results for input(s): CKTOTAL, CKMB, CKMBINDEX, TROPONINI in the last 168 hours. BNP (last 3 results) No results for input(s): PROBNP in the last 8760 hours. HbA1C: No results for input(s): HGBA1C in the last 72 hours. CBG: Recent Labs  Lab 10/26/21 1158 10/26/21 1315 10/26/21 1435 10/26/21 1604 10/26/21 1720  GLUCAP 185* 181* 146* 131* 136*   Lipid Profile: Recent Labs    10/25/21 1339 10/25/21 1923 10/26/21 0028  CHOL 173 147 131  HDL <10* <10* <10*  LDLCALC UNABLE TO CALCULATE IF TRIGLYCERIDE OVER 400 mg/dL UNABLE TO CALCULATE IF TRIGLYCERIDE OVER 400 mg/dL NOT CALCULATED  TRIG 532* 756* 634*  CHOLHDL NOT CALCULATED NOT CALCULATED NOT CALCULATED  LDLDIRECT 13.6 14.6  --    Thyroid Function Tests: No results for input(s): TSH, T4TOTAL, FREET4, T3FREE, THYROIDAB in the last 72 hours. Anemia Panel: No results for input(s): VITAMINB12, FOLATE, FERRITIN, TIBC, IRON, RETICCTPCT in the last 72 hours. Sepsis Labs: Recent Labs  Lab 10/25/21 1023 10/25/21 1923 10/25/21 2329  LATICACIDVEN 4.5* 1.6 1.3    Recent Results (from the past 240 hour(s))  Culture, blood (routine x 2)     Status: None (Preliminary result)   Collection Time: 10/25/21 11:47 AM   Specimen: BLOOD RIGHT FOREARM  Result Value Ref Range Status   Specimen Description BLOOD RIGHT FOREARM  Final   Special Requests   Final    BOTTLES DRAWN AEROBIC AND ANAEROBIC Blood Culture adequate volume   Culture   Final    NO GROWTH < 24 HOURS Performed at [facility], [address] [place], [place] [address]    Report Status PENDING  Incomplete  Resp Panel by RT-PCR (Flu A&B, Covid) Nasopharyngeal Swab     Status: None   Collection Time: 10/25/21 12:43 PM   Specimen: Nasopharyngeal Swab; Nasopharyngeal(NP) swabs in vial transport medium  Result Value Ref Range Status   SARS Coronavirus 2 by RT  PCR NEGATIVE NEGATIVE Final    Comment: (NOTE) SARS-CoV-2 target nucleic acids are NOT DETECTED.  The SARS-CoV-2 RNA is generally detectable in upper respiratory specimens during the acute phase of infection. The lowest concentration of SARS-CoV-2 viral copies this assay can detect is 138 copies/mL. A negative result does not preclude SARS-Cov-2 infection and should not be used as the sole basis for treatment or other patient management decisions. A negative result may occur with  improper specimen collection/handling, submission of specimen other than nasopharyngeal swab, presence of viral mutation(s) within the areas targeted by this assay, and  inadequate number of viral copies(<138 copies/mL). A negative result must be combined with clinical observations, patient history, and epidemiological information. The expected result is Negative.  Fact Sheet for Patients:  BloggerCourse.comhttps://www.fda.gov/media/152166/download  Fact Sheet for Healthcare Providers:  SeriousBroker.ithttps://www.fda.gov/media/152162/download  This test is no t yet approved or cleared by the [place] FDA and  has been authorized for detection and/or diagnosis of SARS-CoV-2 by FDA under an Emergency Use Authorization (EUA). This EUA will remain  in effect (meaning this test can be used) for the duration of the COVID-19 declaration under Section 564(b)(1) of the Act, 21 U.S.C.section 360bbb-3(b)(1), unless the authorization is terminated  or revoked sooner.       Influenza A by PCR NEGATIVE NEGATIVE Final   Influenza B by PCR NEGATIVE NEGATIVE Final    Comment: (NOTE) The Xpert Xpress SARS-CoV-2/FLU/RSV plus assay is intended as an aid in the diagnosis of influenza from Nasopharyngeal swab specimens and should not be used as a sole basis for treatment. Nasal washings and aspirates are unacceptable for Xpert Xpress SARS-CoV-2/FLU/RSV testing.  Fact Sheet for Patients: BloggerCourse.comhttps://www.fda.gov/media/152166/download  Fact Sheet for  Healthcare Providers: SeriousBroker.ithttps://www.fda.gov/media/152162/download  This test is not yet approved or cleared by the [place] FDA and has been authorized for detection and/or diagnosis of SARS-CoV-2 by FDA under an Emergency Use Authorization (EUA). This EUA will remain in effect (meaning this test can be used) for the duration of the COVID-19 declaration under Section 564(b)(1) of the Act, 21 U.S.C. section 360bbb-3(b)(1), unless the authorization is terminated or revoked.  Performed at [facility], [address] [place], [place] [address]     Radiology Studies: CT ABDOMEN PELVIS WO CONTRAST  Result Date: 10/25/2021 CLINICAL DATA:  Acute, severe pancreatitis. Alcohol abuse. Thrombocytopenia. EXAM: CT ABDOMEN AND PELVIS WITHOUT CONTRAST TECHNIQUE: Multidetector CT imaging of the abdomen and pelvis was performed following the standard protocol without IV contrast. RADIATION DOSE REDUCTION: This exam was performed according to the departmental dose-optimization program which includes automated exposure control, adjustment of the mA and/or kV according to patient size and/or use of iterative reconstruction technique. COMPARISON:  Ultrasound earlier today. Abdominal MRI 12/30/2020. CT 11/13/2016 FINDINGS: Lower chest: Clear lung bases. Normal heart size without pericardial or pleural effusion. Hepatobiliary: Marked hepatic steatosis, with mild hepatomegaly at 18.1 cm craniocaudal. No calcified gallstones.  No biliary duct dilatation. Pancreas: Marked peripancreatic edema with fluid throughout the anterior pararenal space, extending into the omentum and along the paracolic gutters. Limited evaluation for more well defined fluid collection, given lack of oral or IV contrast. Spleen: Normal in size, without focal abnormality. Adrenals/Urinary Tract: Normal adrenal glands. Horseshoe kidney, without hydronephrosis or bladder stone. Stomach/Bowel: The antro pyloric region is displaced anteriorly by  the peripancreatic fluid. No gastric outlet obstruction. Otherwise normal small bowel. Normal terminal ileum and appendix. Vascular/Lymphatic: Normal caliber of the aorta and branch vessels. No abdominopelvic adenopathy. Reproductive: Normal prostate. Other: Small volume abdominopelvic ascites. Musculoskeletal: No acute osseous abnormality. IMPRESSION: 1. Severe pancreatitis. Limited evaluation for focal fluid collection given lack of IV contrast. 2. Small volume abdominopelvic ascites. 3. Hepatic steatosis and hepatomegaly. 4. Horseshoe kidney. Electronically Signed   By: Jeronimo GreavesKyle  Talbot M.D.   On: 10/25/2021 11:36   US Abdomen Complete  Result Date: 10/25/2021 CLINICAL DATA:  Abdominal pain and vomiting for several days. Acute pancreatitis. EXAM: ABDOMEN ULTRASOUND COMPLETE COMPARISON:  12/30/2020 FINDINGS: Gallbladder: No gallstones or wall thickening visualized. No sonographic Murphy sign noted by sonographer. Common bile duct: Diameter: 5 mm, within  normal limits. Liver: Diffusely increased echogenicity of the hepatic parenchyma, consistent with hepatic steatosis. No hepatic mass identified. Portal vein is patent on color Doppler imaging with normal direction of blood flow towards the liver. IVC: No abnormality visualized. Pancreas: Visualized portion of the pancreatic head and body appears prominent with mild peripancreatic fluid, highly suspicious for acute pancreatitis. Spleen: Size and appearance within normal limits. Right Kidney: Length: 8.8 cm. Echogenicity within normal limits. No mass or hydronephrosis visualized. Left Kidney: Length: 8.4 cm. Limited visualization due to patient habitus and immobility. Echogenicity within normal limits. No mass or hydronephrosis visualized. Abdominal aorta: No aneurysm visualized. Other findings: Small amount of free fluid seen in Morison's pouch and the left upper quadrant. IMPRESSION: Probable acute pancreatitis, with small amount of peripancreatic and bilateral  upper quadrant fluid. No evidence of gallstones or biliary ductal dilatation. Hepatic steatosis. Electronically Signed   By: Danae Orleans M.D.   On: 10/25/2021 11:07   DG Chest Port 1 View  Result Date: 10/25/2021 CLINICAL DATA:  46 year old male with history of orthopnea. EXAM: PORTABLE CHEST 1 VIEW COMPARISON:  No priors. FINDINGS: Lung volumes are normal. No consolidative airspace disease. No pleural effusions. No pneumothorax. No pulmonary nodule or mass noted. Pulmonary vasculature and the cardiomediastinal silhouette are within normal limits. IMPRESSION: No radiographic evidence of acute cardiopulmonary disease. Electronically Signed   By: Trudie Reed M.D.   On: 10/25/2021 11:02    Scheduled Meds:  folic acid  1 mg Oral Daily   LORazepam  0-4 mg Intravenous Q4H   Followed by   Melene Muller ON 10/28/2021] LORazepam  0-4 mg Intravenous Q8H   multivitamin with minerals  1 tablet Oral Daily   nicotine  21 mg Transdermal Daily   pantoprazole  40 mg Oral Daily   thiamine  250 mg Oral Daily   vitamin B-12  1,000 mcg Oral Daily   Continuous Infusions:  dextrose 5 % and 0.9% NaCl Stopped (10/26/21 1609)   insulin Stopped (10/26/21 1609)    LOS: 1 day   Marguerita Merles, DO [facility] Available via Epic secure chat 7am-7pm After these hours, please refer to coverage provider listed on [email or web address] 10/26/2021, 5:41 PM

## 2021-10-26 NOTE — Consult Note (Signed)
[facility] Consult  Referring Provider: [facility] hospitalist Primary Care Physician:  Pcp, No Primary Gastroenterologist: Althia Forts  Reason for Consultation: Alcoholic pancreatitis, elevated triglycerides  HPI: Stephen Bender is a 46 y.o. male was admitted on 10/25/2021 with severe abdominal pain. Patient states that he was sleeping yesterday morning when he was woken up from sleep due to severe abdominal pain. This was associated with nausea and vomiting. Patient states the pain is generalized, located in epigastric, right upper quadrant, left upper quadrant, mid abdomen, right lower quadrant, left lower quadrant and even spreads to his upper thighs.  Patient states he has been drinking a pint of liquor or 2-3 beers on a daily basis. Patient states that he was admitted with alcoholic pancreatitis in [id number], stop drinking alcohol for a few months but resumed heavy alcohol intake again.  Patient states his mouth is dry and would want his diet to be advanced.  At home he denies difficulty swallowing, pain on swallowing, has mild acid reflux and heartburn, not on medications. Does not recall having an endoscopy in the past. Patient normally has daily bowel movements, intermittently has noted black stools as well as blood in his stool, however has not had episodes of such for the last 2 to 3 months. He had a colonoscopy 3 to 4 years ago in [place] for rectal bleeding and was told he had a few polyps, unclear when he was advised to repeat colonoscopy.  Denies family history of pancreatic disease. No family history of GI malignancy.  Past Medical History:  Diagnosis Date   ETOH abuse    Pancreatitis     History reviewed. No pertinent surgical history.  Prior to Admission medications   Medication Sig Start Date End Date Taking? Authorizing Provider  Tetrahydrozoline HCl (VISINE OP) Place 1 drop into both eyes daily as needed (dry eyes).   Yes [provider]   pantoprazole (PROTONIX) 40 MG tablet Take 1 tablet (40 mg total) by mouth daily. Patient not taking: Reported on 10/25/2021 12/31/20 12/31/21  Kathie Dike, MD    Current Facility-Administered Medications  Medication Dose Route Frequency Provider Last Rate Last Admin   dextrose 5 %-0.9 % sodium chloride infusion   Intravenous Continuous Wynetta Fines T, MD 150 mL/hr at 10/26/21 0650 New Bag at 10/26/21 0650   insulin regular, human (MYXREDLIN) 100 units/ 100 mL infusion   Intravenous Continuous Wynetta Fines T, MD 1 mL/hr at 10/26/21 1016 1 Units/hr at 10/26/21 1016   LORazepam (ATIVAN) tablet 1-4 mg  1-4 mg Oral Q1H PRN Wynetta Fines T, MD   1 mg at 10/26/21 0546   Or   LORazepam (ATIVAN) injection 1-4 mg  1-4 mg Intravenous Q1H PRN Wynetta Fines T, MD       morphine (PF) 4 MG/ML injection 4 mg  4 mg Intravenous Q2H PRN Wynetta Fines T, MD   4 mg at 10/26/21 1004   nicotine (NICODERM CQ - dosed in mg/24 hours) patch 21 mg  21 mg Transdermal Daily Wynetta Fines T, MD   21 mg at 10/26/21 0800   ondansetron (ZOFRAN) tablet 4 mg  4 mg Oral Q6H PRN Wynetta Fines T, MD       Or   ondansetron [facility]) injection 4 mg  4 mg Intravenous Q6H PRN Wynetta Fines T, MD   4 mg at 10/26/21 1008   pantoprazole (PROTONIX) EC tablet 40 mg  40 mg Oral Daily Wynetta Fines T, MD   40 mg at 10/26/21 0800   thiamine  tablet 250 mg  250 mg Oral Daily Wynetta Fines T, MD   250 mg at 10/26/21 0800   vitamin B-12 (CYANOCOBALAMIN) tablet 1,000 mcg  1,000 mcg Oral Daily Wynetta Fines T, MD   1,000 mcg at 10/26/21 0800    Allergies as of 10/25/2021   (No Known Allergies)    History reviewed. No pertinent family history.  Social History   Socioeconomic History   Marital status: Married    Spouse name: Not on file   Number of children: Not on file   Years of education: Not on file   Highest education level: Not on file  Occupational History   Not on file  Tobacco Use   Smoking status: Every Day    Packs/day: 1.00    Types:  Cigarettes   Smokeless tobacco: Never  Vaping Use   Vaping Use: Never used  Substance and Sexual Activity   Alcohol use: Yes   Drug use: No   Sexual activity: Not on file  Other Topics Concern   Not on file  Social History Narrative   Not on file   Social Determinants of Health   Financial Resource Strain: Not on file  Food Insecurity: Not on file  Transportation Needs: Not on file  Physical Activity: Not on file  Stress: Not on file  Social Connections: Not on file  Intimate Partner Violence: Not on file    Review of Systems: As per HPI Physical Exam: Vital signs in last 24 hours: Temp:  [97.8 F (36.6 C)-98.4 F (36.9 C)] 98.2 F (36.8 C) (03/12 0742) Pulse Rate:  [110-135] 120 (03/12 0742) Resp:  [13-26] 16 (03/12 0742) BP: (95-119)/(68-87) 115/84 (03/12 0742) SpO2:  [91 %-96 %] 93 % (03/12 0742)    General:   Tremulous Head:  Normocephalic and atraumatic. Eyes:  Sclera clear, no icterus.   Conjunctiva pink. Ears:  Normal auditory acuity. Nose:  No deformity, discharge,  or lesions. Mouth:  No deformity or lesions.  Oropharynx pink & moist. Neck:  Supple; no masses or thyromegaly. Lungs:  Clear throughout to auscultation.   No wheezes, crackles, or rhonchi. No acute distress. Heart: Tachycardic extremities:  Without clubbing or edema. Neurologic:  Alert and  oriented x4;  grossly normal neurologically. Skin: Multiple tattoos Psych:  Alert and cooperative. Normal mood and affect. Abdomen: Diffuse generalized tenderness, sluggish bowel sounds        Lab Results: Recent Labs    10/25/21 0912 10/25/21 1023 10/26/21 0028  WBC 7.3 7.8 6.7  HGB 21.5* 19.9* 17.1*  HCT 61.4* 58.3* 50.8  PLT 63* 57* 35*   BMET Recent Labs    10/25/21 0912 10/25/21 1923 10/26/21 0028  NA 129* 128* 129*  K 4.8 5.1 4.4  CL 93* 99 100  CO2 16* 18* 21*  GLUCOSE 170* 147* 155*  BUN 25* 30* 29*  CREATININE 2.68* 2.28* 1.90*  CALCIUM 7.8* 6.6* 6.7*   LFT Recent Labs     10/26/21 0028  PROT 5.3*  ALBUMIN 2.4*  AST 162*  ALT 90*  ALKPHOS 78  BILITOT 1.7*   PT/INR Recent Labs    10/25/21 1425  LABPROT 12.3  INR 0.9    Studies/Results: CT ABDOMEN PELVIS WO CONTRAST  Result Date: 10/25/2021 CLINICAL DATA:  Acute, severe pancreatitis. Alcohol abuse. Thrombocytopenia. EXAM: CT ABDOMEN AND PELVIS WITHOUT CONTRAST TECHNIQUE: Multidetector CT imaging of the abdomen and pelvis was performed following the standard protocol without IV contrast. RADIATION DOSE REDUCTION: This exam was performed according  to the departmental dose-optimization program which includes automated exposure control, adjustment of the mA and/or kV according to patient size and/or use of iterative reconstruction technique. COMPARISON:  Ultrasound earlier today. Abdominal MRI 12/30/2020. CT 11/13/2016 FINDINGS: Lower chest: Clear lung bases. Normal heart size without pericardial or pleural effusion. Hepatobiliary: Marked hepatic steatosis, with mild hepatomegaly at 18.1 cm craniocaudal. No calcified gallstones.  No biliary duct dilatation. Pancreas: Marked peripancreatic edema with fluid throughout the anterior pararenal space, extending into the omentum and along the paracolic gutters. Limited evaluation for more well defined fluid collection, given lack of oral or IV contrast. Spleen: Normal in size, without focal abnormality. Adrenals/Urinary Tract: Normal adrenal glands. Horseshoe kidney, without hydronephrosis or bladder stone. Stomach/Bowel: The antro pyloric region is displaced anteriorly by the peripancreatic fluid. No gastric outlet obstruction. Otherwise normal small bowel. Normal terminal ileum and appendix. Vascular/Lymphatic: Normal caliber of the aorta and branch vessels. No abdominopelvic adenopathy. Reproductive: Normal prostate. Other: Small volume abdominopelvic ascites. Musculoskeletal: No acute osseous abnormality. IMPRESSION: 1. Severe pancreatitis. Limited evaluation for focal  fluid collection given lack of IV contrast. 2. Small volume abdominopelvic ascites. 3. Hepatic steatosis and hepatomegaly. 4. Horseshoe kidney. Electronically Signed   By: Abigail Miyamoto M.D.   On: 10/25/2021 11:36   US Abdomen Complete  Result Date: 10/25/2021 CLINICAL DATA:  Abdominal pain and vomiting for several days. Acute pancreatitis. EXAM: ABDOMEN ULTRASOUND COMPLETE COMPARISON:  12/30/2020 FINDINGS: Gallbladder: No gallstones or wall thickening visualized. No sonographic Murphy sign noted by sonographer. Common bile duct: Diameter: 5 mm, within normal limits. Liver: Diffusely increased echogenicity of the hepatic parenchyma, consistent with hepatic steatosis. No hepatic mass identified. Portal vein is patent on color Doppler imaging with normal direction of blood flow towards the liver. IVC: No abnormality visualized. Pancreas: Visualized portion of the pancreatic head and body appears prominent with mild peripancreatic fluid, highly suspicious for acute pancreatitis. Spleen: Size and appearance within normal limits. Right Kidney: Length: 8.8 cm. Echogenicity within normal limits. No mass or hydronephrosis visualized. Left Kidney: Length: 8.4 cm. Limited visualization due to patient habitus and immobility. Echogenicity within normal limits. No mass or hydronephrosis visualized. Abdominal aorta: No aneurysm visualized. Other findings: Small amount of free fluid seen in Morison's pouch and the left upper quadrant. IMPRESSION: Probable acute pancreatitis, with small amount of peripancreatic and bilateral upper quadrant fluid. No evidence of gallstones or biliary ductal dilatation. Hepatic steatosis. Electronically Signed   By: Marlaine Hind M.D.   On: 10/25/2021 11:07   DG Chest Port 1 View  Result Date: 10/25/2021 CLINICAL DATA:  46 year old male with history of orthopnea. EXAM: PORTABLE CHEST 1 VIEW COMPARISON:  No priors. FINDINGS: Lung volumes are normal. No consolidative airspace disease. No pleural  effusions. No pneumothorax. No pulmonary nodule or mass noted. Pulmonary vasculature and the cardiomediastinal silhouette are within normal limits. IMPRESSION: No radiographic evidence of acute cardiopulmonary disease. Electronically Signed   By: Vinnie Langton M.D.   On: 10/25/2021 11:02    Impression: Severe pancreatitis likely related to significant alcohol use as well as hypertriglyceridemia-received 2 L fluid bolus, currently on 150 cc an hour of D5 normal saline Lipase 897 on admission Triglycerides 915 on admission-started on IV insulin drip CT without contrast showed marked peripancreatic edema extending into omentum and paracolic gutter   Lactic acidosis, 4.5 on admission, improved to 1.3 today  hyponatremia, sodium 129 Acidosis, bicarb improved from 18-21 Acute kidney injury, renal impairment,  BUN/creatinine/GFR   25/2.68/29 on admission 30/2.28/35 on repeat labs,  has improved to 29/1.9/44 Coincidentally noted to have horseshoe kidney   Severely hemoconcentrated, hemoglobin 21.7 on admission, with IV fluids at 17.1 today  Thrombocytopenia, platelet count 35  Elevated LFTs, T. bili 1.7/AST 162/ALT 90/ALP 78, compatible with acute alcoholic hepatitis, however with a normal GFR and low discriminant function Hepatic steatosis and hepatomegaly on CT Ultrasound showed hepatic steatosis, no gallstones, no biliary ductal dilatation   Plan: Continue aggressive IV fluid resuscitation, currently on D5 normal saline at 150 cc an hour Improving renal function and improving hematocrit are reassuring We will start on clear liquids  Continue monitoring for alcohol withdrawal, currently on IV Ativan as needed along with thiamine and vitamin B12 We will start on folic acid and multivitamin as well  Pain seems to be adequately controlled on morphine 4 mg IV every 2 hours as needed Continue pantoprazole 40 mg daily  Discussed about importance of complete abstinence from alcohol use  with the patient along with his mother and girlfriend at bedside.   LOS: 1 day   Ronnette Juniper, MD  10/26/2021, 10:43 AM

## 2021-10-26 NOTE — Assessment & Plan Note (Addendum)
-   Likely contributing to his acute pancreatitis and is trending down with last triglyceride level 634 and has now improved to 119 ?-Stop Insulin gtt ?-Repeat triglyceride level in the morning ?

## 2021-10-26 NOTE — Assessment & Plan Note (Addendum)
Metabolic Acidosis -Patient's BUNs/creatinine on admission was 25/2.68 -> 29/1.90; continue with D5 normal saline but reduced from 150 MLS per hour to 100 mL/hr x1 more day we will also give and repeat 1 L normal saline bolus -Patient's CO2 is now 25, anion gap is 7, chloride level is now 100; much improved since admission -Continue to monitor and avoid nephrotoxic medications, contrast dyes, hypotension and and dehydration and renally adjust medications -Repeat CMP in the a.m.

## 2021-10-26 NOTE — Assessment & Plan Note (Addendum)
Hyperbilirubinemia  - In the setting of alcoholic pancreatitis.  Patient's AST is now trended down and went from 334 and is now 162 -> 77 and patient's ALT is now trended down from 161 is now 90 -> 51 -Does not qualify for prednisolone based on moderate discriminant score -Bilirubin was trending down as well and 3.1 -> 1.7 -> 2.1 -Continue to monitor Hepatic Fxn Panel and may obtain RUQ U/S

## 2021-10-26 NOTE — Assessment & Plan Note (Addendum)
-  Likely Multifactorial as Patient has Alcohol Abuse Hx and because his TG were >900 -BISAP=3, which considered to be severe, will admit to PCU for close monitoring. -Initially NPO but now on CLD per GI and will continue for now with Diet Advancement per them -Morphine for pain  -CT abd reviewed, no CBD etiology, and given the correlation of recurrent alcohol abuse and pancreatitis, will not repeat MRCP this time.   -Lipid Panel checked and showed evaded triglycerides on admission at 915 is now trended on 634 -> 119 -Given another dose of IV fluid boluses of normal saline 1 L and will continue D5 normal saline at 150 MLS/hr and reduce to 100 mL/hr  -GI consulted and appreciate further recommendationsand they recommend continue aggressive IV fluid resuscitation  -Lipase level is trending down we will repeat in the morning; Repeat Lipase Level is now 129

## 2021-10-26 NOTE — Assessment & Plan Note (Addendum)
-  Increased to the stepdown unit CIWA monitoring protocol and will give scheduled lorazepam ?-Continue folic acid, thiamine, multivitamin and will consider given his increased agitation and hallucinations ?-Since he worsened overnight he was started on Librium Taper ?

## 2021-10-27 LAB — CBC WITH DIFFERENTIAL/PLATELET
Abs Immature Granulocytes: 0 10*3/uL (ref 0.00–0.07)
Basophils Absolute: 0 10*3/uL (ref 0.0–0.1)
Basophils Relative: 0 %
Eosinophils Absolute: 0.1 10*3/uL (ref 0.0–0.5)
Eosinophils Relative: 1 %
HCT: 40.7 % (ref 39.0–52.0)
Hemoglobin: 14.1 g/dL (ref 13.0–17.0)
Lymphocytes Relative: 3 %
Lymphs Abs: 0.2 10*3/uL — ABNORMAL LOW (ref 0.7–4.0)
MCH: 34.1 pg — ABNORMAL HIGH (ref 26.0–34.0)
MCHC: 34.6 g/dL (ref 30.0–36.0)
MCV: 98.5 fL (ref 80.0–100.0)
Monocytes Absolute: 0.4 10*3/uL (ref 0.1–1.0)
Monocytes Relative: 8 %
Neutro Abs: 4.8 10*3/uL (ref 1.7–7.7)
Neutrophils Relative %: 88 %
Platelets: 33 10*3/uL — ABNORMAL LOW (ref 150–400)
RBC: 4.13 MIL/uL — ABNORMAL LOW (ref 4.22–5.81)
RDW: 14.4 % (ref 11.5–15.5)
WBC: 5.5 10*3/uL (ref 4.0–10.5)
nRBC: 0 % (ref 0.0–0.2)
nRBC: 0 /100 WBC

## 2021-10-27 LAB — LIPID PANEL
Cholesterol: 83 mg/dL (ref 0–200)
HDL: 11 mg/dL — ABNORMAL LOW (ref >40)
LDL Cholesterol: 48 mg/dL (ref 0–99)
Total CHOL/HDL Ratio: 7.5 RATIO
Triglycerides: 119 mg/dL (ref <150)
VLDL: 24 mg/dL (ref 0–40)

## 2021-10-27 LAB — GLUCOSE, CAPILLARY
Glucose-Capillary: 105 mg/dL — ABNORMAL HIGH (ref 70–99)
Glucose-Capillary: 105 mg/dL — ABNORMAL HIGH (ref 70–99)
Glucose-Capillary: 107 mg/dL — ABNORMAL HIGH (ref 70–99)
Glucose-Capillary: 109 mg/dL — ABNORMAL HIGH (ref 70–99)
Glucose-Capillary: 118 mg/dL — ABNORMAL HIGH (ref 70–99)
Glucose-Capillary: 118 mg/dL — ABNORMAL HIGH (ref 70–99)
Glucose-Capillary: 119 mg/dL — ABNORMAL HIGH (ref 70–99)
Glucose-Capillary: 131 mg/dL — ABNORMAL HIGH (ref 70–99)
Glucose-Capillary: 143 mg/dL — ABNORMAL HIGH (ref 70–99)
Glucose-Capillary: 97 mg/dL (ref 70–99)
Glucose-Capillary: 97 mg/dL (ref 70–99)
Glucose-Capillary: 97 mg/dL (ref 70–99)

## 2021-10-27 LAB — COMPREHENSIVE METABOLIC PANEL
ALT: 51 U/L — ABNORMAL HIGH (ref 0–44)
AST: 77 U/L — ABNORMAL HIGH (ref 15–41)
Albumin: 2.1 g/dL — ABNORMAL LOW (ref 3.5–5.0)
Alkaline Phosphatase: 58 U/L (ref 38–126)
Anion gap: 7 (ref 5–15)
BUN: 19 mg/dL (ref 6–20)
CO2: 25 mmol/L (ref 22–32)
Calcium: 7.2 mg/dL — ABNORMAL LOW (ref 8.9–10.3)
Chloride: 100 mmol/L (ref 98–111)
Creatinine, Ser: 1.11 mg/dL (ref 0.61–1.24)
GFR, Estimated: >60 mL/min (ref >60)
Glucose, Bld: 90 mg/dL (ref 70–99)
Potassium: 4.1 mmol/L (ref 3.5–5.1)
Sodium: 132 mmol/L — ABNORMAL LOW (ref 135–145)
Total Bilirubin: 2.1 mg/dL — ABNORMAL HIGH (ref 0.3–1.2)
Total Protein: 4.6 g/dL — ABNORMAL LOW (ref 6.5–8.1)

## 2021-10-27 LAB — PHOSPHORUS: Phosphorus: 1.6 mg/dL — ABNORMAL LOW (ref 2.5–4.6)

## 2021-10-27 LAB — LIPASE, BLOOD: Lipase: 129 U/L — ABNORMAL HIGH (ref 11–51)

## 2021-10-27 LAB — HEMOGLOBIN A1C
Hgb A1c MFr Bld: 5.5 % (ref 4.8–5.6)
Mean Plasma Glucose: 111 mg/dL

## 2021-10-27 LAB — MAGNESIUM: Magnesium: 1.4 mg/dL — ABNORMAL LOW (ref 1.7–2.4)

## 2021-10-27 MED ORDER — MAGNESIUM SULFATE 4 GM/100ML IV SOLN
4.0000 g | Freq: Once | INTRAVENOUS | Status: AC
  Administered 2021-10-27: 4 g via INTRAVENOUS
  Filled 2021-10-27: qty 100

## 2021-10-27 MED ORDER — CALCIUM GLUCONATE-NACL 1-0.675 GM/50ML-% IV SOLN
1.0000 g | Freq: Once | INTRAVENOUS | Status: AC
  Administered 2021-10-27: 1000 mg via INTRAVENOUS
  Filled 2021-10-27: qty 50

## 2021-10-27 MED ORDER — SODIUM PHOSPHATES 45 MMOLE/15ML IV SOLN
30.0000 mmol | Freq: Once | INTRAVENOUS | Status: AC
  Administered 2021-10-27: 30 mmol via INTRAVENOUS
  Filled 2021-10-27: qty 10

## 2021-10-27 MED ORDER — DEXTROSE-NACL 5-0.9 % IV SOLN: INTRAVENOUS | Status: AC

## 2021-10-27 MED ORDER — SODIUM CHLORIDE 0.9 % IV BOLUS
1000.0000 mL | Freq: Once | INTRAVENOUS | Status: AC
  Administered 2021-10-27: 1000 mL via INTRAVENOUS

## 2021-10-27 NOTE — Assessment & Plan Note (Signed)
-  Patient's Phos Level is now 1.6 -Replete with IV Sodium Phos 30 mmol -Continue to Monitor and Replete as Necessary -Repeat Phos Level in the AM

## 2021-10-27 NOTE — Plan of Care (Signed)

## 2021-10-27 NOTE — Progress Notes (Signed)
UNASSIGNED PATIENT ?Subjective: ?Stephen GrayerJoshua Bender is a [id number] year old white male admitted for pancreatitis thought to be due to alcohol abuse and hypertriglyceridemia.  Clinically he seems to be improving.  He is tolerating clear liquids well.  Patient is insisting on going home and is being somewhat argumentative with the staff.. He denies having abdominal pain nausea vomiting.  He has not had a BM in a couple of days. ? ?Objective: ?Vital signs in last 24 hours: ?Temp:  [98 ?F (36.7 ?C)-98.9 ?F (37.2 ?C)] 98.9 ?F (37.2 ?C) (03/12 2028) ?Pulse Rate:  [96-150] 122 (03/13 0302) ?Resp:  [14-33] 14 (03/13 0302) ?BP: (102-129)/(64-95) 111/75 (03/13 0302) ?SpO2:  [76 %-97 %] 95 % (03/13 0302) ?Last BM Date : 10/26/21 ? ?Intake/Output from previous day: ?03/12 0701 - 03/13 0700 ?In: 3138.3 [I.V.:3055.7; IV Piggyback:82.6] ?Out: 715 [Urine:715] ?Intake/Output this shift: ?No intake/output data recorded. ? ?General appearance: combative, delirious, distracted, fatigued, and uncooperative ?Resp: clear to auscultation bilaterally ?Cardio: regular rate and rhythm, S1, S2 normal, no murmur, click, rub or gallop ?GI: soft, non-tender; bowel sounds normal; no masses,  no organomegaly ? ?Lab Results: ?Recent Labs  ?  10/25/21 ?1023 10/26/21 ?0028 10/27/21 ?0408  ?WBC 7.8 6.7 5.5  ?HGB 19.9* 17.1* 14.1  ?HCT 58.3* 50.8 40.7  ?PLT 57* 35* 33*  ? ?BMET ?Recent Labs  ?  10/25/21 ?1923 10/26/21 ?0028 10/27/21 ?0408  ?NA 128* 129* 132*  ?K 5.1 4.4 4.1  ?CL 99 100 100  ?CO2 18* 21* 25  ?GLUCOSE 147* 155* 90  ?BUN 30* 29* 19  ?CREATININE 2.28* 1.90* 1.11  ?CALCIUM 6.6* 6.7* 7.2*  ? ?LFT ?Recent Labs  ?  10/27/21 ?0408  ?PROT 4.6*  ?ALBUMIN 2.1*  ?AST 77*  ?ALT 51*  ?ALKPHOS 58  ?BILITOT 2.1*  ? ?PT/INR ?Recent Labs  ?  10/25/21 ?1425  ?LABPROT 12.3  ?INR 0.9  ? ?Hepatitis Panel ?No results for input(s): HEPBSAG, HCVAB, HEPAIGM, HEPBIGM in the last 72 hours. ?C-Diff ?No results for input(s): CDIFFTOX in the last 72 hours. ?No results for  input(s): CDIFFPCR in the last 72 hours. ?Fecal Lactopherrin ?No results for input(s): FECLLACTOFRN in the last 72 hours. ? ?Studies/Results: ?CT ABDOMEN PELVIS WO CONTRAST ? ?Result Date: 10/25/2021 ?CLINICAL DATA:  Acute, severe pancreatitis. Alcohol abuse. Thrombocytopenia. EXAM: CT ABDOMEN AND PELVIS WITHOUT CONTRAST TECHNIQUE: Multidetector CT imaging of the abdomen and pelvis was performed following the standard protocol without IV contrast. RADIATION DOSE REDUCTION: This exam was performed according to the departmental dose-optimization program which includes automated exposure control, adjustment of the mA and/or kV according to patient size and/or use of iterative reconstruction technique. COMPARISON:  Ultrasound earlier today. Abdominal MRI 12/30/2020. CT 11/13/2016 FINDINGS: Lower chest: Clear lung bases. Normal heart size without pericardial or pleural effusion. Hepatobiliary: Marked hepatic steatosis, with mild hepatomegaly at 18.1 cm craniocaudal. No calcified gallstones.  No biliary duct dilatation. Pancreas: Marked peripancreatic edema with fluid throughout the anterior pararenal space, extending into the omentum and along the paracolic gutters. Limited evaluation for more well defined fluid collection, given lack of oral or IV contrast. Spleen: Normal in size, without focal abnormality. Adrenals/Urinary Tract: Normal adrenal glands. Horseshoe kidney, without hydronephrosis or bladder stone. Stomach/Bowel: The antro pyloric region is displaced anteriorly by the peripancreatic fluid. No gastric outlet obstruction. Otherwise normal small bowel. Normal terminal ileum and appendix. Vascular/Lymphatic: Normal caliber of the aorta and branch vessels. No abdominopelvic adenopathy. Reproductive: Normal prostate. Other: Small volume abdominopelvic ascites. Musculoskeletal: No acute osseous abnormality. IMPRESSION:  1. Severe pancreatitis. Limited evaluation for focal fluid collection given lack of IV contrast.  2. Small volume abdominopelvic ascites. 3. Hepatic steatosis and hepatomegaly. 4. Horseshoe kidney. Electronically Signed   By: Jeronimo Greaves M.D.   On: 10/25/2021 11:36  ? ?US Abdomen Complete ? ?Result Date: 10/25/2021 ?CLINICAL DATA:  Abdominal pain and vomiting for several days. Acute pancreatitis. EXAM: ABDOMEN ULTRASOUND COMPLETE COMPARISON:  12/30/2020 FINDINGS: Gallbladder: No gallstones or wall thickening visualized. No sonographic Murphy sign noted by sonographer. Common bile duct: Diameter: 5 mm, within normal limits. Liver: Diffusely increased echogenicity of the hepatic parenchyma, consistent with hepatic steatosis. No hepatic mass identified. Portal vein is patent on color Doppler imaging with normal direction of blood flow towards the liver. IVC: No abnormality visualized. Pancreas: Visualized portion of the pancreatic head and body appears prominent with mild peripancreatic fluid, highly suspicious for acute pancreatitis. Spleen: Size and appearance within normal limits. Right Kidney: Length: 8.8 cm. Echogenicity within normal limits. No mass or hydronephrosis visualized. Left Kidney: Length: 8.4 cm. Limited visualization due to patient habitus and immobility. Echogenicity within normal limits. No mass or hydronephrosis visualized. Abdominal aorta: No aneurysm visualized. Other findings: Small amount of free fluid seen in Morison's pouch and the left upper quadrant. IMPRESSION: Probable acute pancreatitis, with small amount of peripancreatic and bilateral upper quadrant fluid. No evidence of gallstones or biliary ductal dilatation. Hepatic steatosis. Electronically Signed   By: Danae Orleans M.D.   On: 10/25/2021 11:07  ? ?DG Chest Port 1 View ? ?Result Date: 10/25/2021 ?CLINICAL DATA:  46 year old male with history of orthopnea. EXAM: PORTABLE CHEST 1 VIEW COMPARISON:  No priors. FINDINGS: Lung volumes are normal. No consolidative airspace disease. No pleural effusions. No pneumothorax. No pulmonary  nodule or mass noted. Pulmonary vasculature and the cardiomediastinal silhouette are within normal limits. IMPRESSION: No radiographic evidence of acute cardiopulmonary disease. Electronically Signed   By: Trudie Reed M.D.   On: 10/25/2021 11:02   ? ?Medications: I have reviewed the patient's current medications. ?Prior to Admission:  ?Medications Prior to Admission  ?Medication Sig Dispense Refill Last Dose  ? Tetrahydrozoline HCl (VISINE OP) Place 1 drop into both eyes daily as needed (dry eyes).   Past Week  ? pantoprazole (PROTONIX) 40 MG tablet Take 1 tablet (40 mg total) by mouth daily. (Patient not taking: Reported on 10/25/2021) 30 tablet 1 Not Taking  ? ?Scheduled: ? chlordiazePOXIDE  25 mg Oral TID AC & HS  ? Followed by  ? chlordiazePOXIDE  25 mg Oral Q8H  ? Followed by  ? [START ON 10/28/2021] chlordiazePOXIDE  25 mg Oral BH-qamhs  ? Followed by  ? [START ON 10/29/2021] chlordiazePOXIDE  25 mg Oral Daily  ? folic acid  1 mg Oral Daily  ? LORazepam  0-4 mg Intravenous Q4H  ? Followed by  ? [START ON 10/28/2021] LORazepam  0-4 mg Intravenous Q8H  ? multivitamin with minerals  1 tablet Oral Daily  ? nicotine  21 mg Transdermal Daily  ? pantoprazole  40 mg Oral Daily  ? thiamine  250 mg Oral Daily  ? vitamin B-12  1,000 mcg Oral Daily  ? ?Continuous: ? dextrose 5 % and 0.9% NaCl 150 mL/hr at 10/27/21 0428  ? magnesium sulfate bolus IVPB 4 g (10/27/21 1536)  ? ? ?Assessment/Plan: ?1) Alcoholic pancreatitis with hyperbilirubinemia and alcohol withdrawal syndrome-continue present care with CIWA protocol. ?2) Lactic acidosis seem to be improving. ?3) Hyponatremia/hypocalcemia/hyperglycemia. ? ? LOS: 2 days  ? ?Donat Humble ?  10/27/2021, 6:31 AM ? ? ?

## 2021-10-27 NOTE — Progress Notes (Addendum)
PROGRESS NOTE    Stephen Bender  ZOX:[id number] DOB: 1976-04-25 DOA: 10/25/2021 PCP: Pcp, No   Brief Narrative:  HPI per Dr. Mikey College Stephen Bender is a 46 y.o. male with medical history significant of alcohol abuse, recurrent alcoholic pancreatitis, came with new onset of epigastric pain, feeling nauseous and vomiting.   Patient was hospitalized in May 2022 for alcoholic pancreatitis underwent MRCP which showed no CBD etiology.  Patient was treated and stabilized and discharged home and since then patient has been staying away from alcohol until 1 week ago, patient picked up drinking again.  2-3 beers a day and last drink was Thursday night.  Monday morning, patient started to feel epigastric pain, cramping-like, radiating to the back, along with severe nauseous vomiting of stomach content, he has not been able to eat or drink since yesterday morning.  Denies any fever chills no diarrhea.   ED Course: Patient was found to have hypotensive and tachycardia, afebrile.   Blood pressure responded to IV bolus total of 2000 mL.  CT abdomen pelvis showed severe pancreatitis.  Small volume of abdominal pelvic ascites and small volume of bilateral pleural effusions.  Hepatic steatosis and hepatomegaly.   Blood work showed AKI creatinine 2.6 BUN 25, bicarb 16, K4.8, sodium 129.  Hemoconcentration of hemoglobin 21.5> 19.9.  WBC 7.3.  AST 334, ALT 161, total bili 3.1.  Lactic acid 4.5.   **Interim History Patient's Abdominal Pain is improving and TG and Lipase Level trending down. GI consulted and recommending continuing Fluid. He is starting to withdraw more so will escalate CIWA protocol to SDU monitoring and a Librium Taper was placed. LFTS, Renal Fxn and T Bili improving along with TG. LA resolved.   Since his lipid profile is improved we will stop his insulin drip and his CBG monitoring.  We will continue IV fluid hydration he was given additional 1 L bolus today. Will continue to Monitor and reduce IVF  to 100 mL/hr x 1 day.     Assessment and Plan: * Pancreatitis -Likely Multifactorial as Patient has Alcohol Abuse Hx and because his TG were >900 -BISAP=3, which considered to be severe, will admit to PCU for close monitoring. -Initially NPO but now on CLD per GI and will continue for now with Diet Advancement per them -Morphine for pain  -CT abd reviewed, no CBD etiology, and given the correlation of recurrent alcohol abuse and pancreatitis, will not repeat MRCP this time.   -Lipid Panel checked and showed evaded triglycerides on admission at 915 is now trended on 634 -> 119 -Given another dose of IV fluid boluses of normal saline 1 L and will continue D5 normal saline at 150 MLS/hr and reduce to 100 mL/hr  -GI consulted and appreciate further recommendations and they recommend continue aggressive IV fluid resuscitation  -Lipase level is trending down we will repeat in the morning; Repeat Lipase Level is now 129  Hypophosphatemia -Patient's Phos Level is now 1.6 -Replete with IV Sodium Phos 30 mmol -Continue to Monitor and Replete as Necessary -Repeat Phos Level in the AM   Hypomagnesemia -Patient's Mag Level is 1.4 -Replete with IV Mag Sulfate 4 grams -Continue to Monitor and Replete as Necessary -Repeat Mag Level in the AM   Lactic acidosis -Patient's LA went from 4.5 -> 1.3 and resolved -Continue to Monitor and Trend  Abnormal LFTs Hyperbilirubinemia  - In the setting of alcoholic pancreatitis.  Patient's AST is now trended down and went from 334 and is now 162 ->  77 and patient's ALT is now trended down from 161 is now 90 -> 51 -Does not qualify for prednisolone based on moderate discriminant score -Bilirubin was trending down as well and 3.1 -> 1.7 -> 2.1 -Continue to monitor Hepatic Fxn Panel and may obtain RUQ U/S   Hyponatremia -Na+ is now gone from 129 -> 128 -> 129 -> 132 -Continue with IV fluid hydration as delineated -Continue to monitor and trend and repeat CMP  in the a.m.  Alcohol withdrawal (HCC) - Increased to the stepdown unit CIWA monitoring protocol and will give scheduled lorazepam -Continue folic acid, thiamine, multivitamin and will consider given his increased agitation and hallucinations -Since he worsened overnight he was started on Librium Taper  Hyperglycemia -Likely reactive and will continue monitor and repeat hemoglobin A1c in the morning to evaluate for diabetes and he does not have any and it was 5.5 -Insulin gtt stopped  -If necessary will place on sensitive NovoLog/scale insulin AC  Hypocalcemia - Patient's calcium was 7.2 this AM  -We will give a dose of calcium gluconate 1 g again  AKI (acute kidney injury) (HCC) Metabolic Acidosis -Patient's BUNs/creatinine on admission was 25/2.68 -> 29/1.90; continue with D5 normal saline but reduced from 150 MLS per hour to 100 mL/hr x1 more day we will also give and repeat 1 L normal saline bolus -Patient's CO2 is now 25, anion gap is 7, chloride level is now 100; much improved since admission -Continue to monitor and avoid nephrotoxic medications, contrast dyes, hypotension and and dehydration and renally adjust medications -Repeat CMP in the a.m.   Hypertriglyceridemia - Likely contributing to his acute pancreatitis and is trending down with last triglyceride level 634 and has now improved to 119 -Stop Insulin gtt -Repeat triglyceride level in the morning  Thrombocytopenia (HCC) - In the setting of his alcoholism and splenic sequestration; patient's platelet count has dropped from admission is now 35 yesterday and today is 33 we will need to continue monitor for signs and symptoms of bleeding; currently no overt bleeding noted  DVT prophylaxis: SCDs Start: 10/25/21 1248    Code Status: Full Code Family Communication: Discussed with parents at beside   Disposition Plan:  Level of care: Progressive Status is: Inpatient Remains inpatient appropriate because:  Has severe  Pancreatitis and is withdrawing   Consultants:  Gastroenterology  Procedures:  None  Antimicrobials:  Anti-infectives (From admission, onward)    None       Subjective: Seen And examined at bedside and he is still withdrawing but is a little bit more awake and alert.  Denies any abdominal pain.  Tolerating clear liquid diet for now.  No nausea or vomiting.  No other concerns or plans at this time  Objective: Vitals:   10/27/21 0202 10/27/21 0302 10/27/21 0717 10/27/21 1619  BP: 112/77 111/75 (!) 146/75   Pulse: (!) 130 (!) 122 (!) 134   Resp: 14 14    Temp:   98.3 F (36.8 C) 98.6 F (37 C)  TempSrc:   Oral Axillary  SpO2: 97% 95%  90%  Weight:      Height:        Intake/Output Summary (Last 24 hours) at 10/27/2021 1837 Last data filed at 10/27/2021 1800 Gross per 24 hour  Intake 2346.7 ml  Output --  Net 2346.7 ml   Filed Weights   10/25/21 0816  Weight: 72.6 kg   Examination: Physical Exam:  Constitutional: Thin Caucasian male currently in no acute distress appears calm  Eyes: Has some scleral icterus Neck: Appears normal, supple, no cervical masses, normal ROM, no appreciable thyromegaly; no appreciable JVD Respiratory: Diminished to auscultation bilaterally with coarse breath sounds, no wheezing, rales, rhonchi or crackles. Normal respiratory effort and patient is not tachypenic. No accessory muscle use.  Unlabored breathing Cardiovascular: Tachycardic rate but regular rhythm, no murmurs / rubs / gallops. S1 and S2 auscultated. No extremity edema.  Abdomen: Soft, non-tender, non-distended. Bowel sounds positive.  GU: Deferred. Musculoskeletal: No clubbing / cyanosis of digits/nails. No joint deformity upper and lower extremities.  Skin: No rashes, lesions, ulcers on limited skin evaluation but has multiple tattoos scattered throughout his body.. No induration; Warm and dry.  Neurologic: CN 2-12 grossly intact with no focal deficits.  Slightly tremulous  Data  Reviewed: I have personally reviewed following labs and imaging studies  CBC: Recent Labs  Lab 10/25/21 0912 10/25/21 1023 10/26/21 0028 10/27/21 0408  WBC 7.3 7.8 6.7 5.5  NEUTROABS 6.6  --   --  4.8  HGB 21.5* 19.9* 17.1* 14.1  HCT 61.4* 58.3* 50.8 40.7  MCV 100.5* 100.9* 99.4 98.5  PLT 63* 57* 35* 33*   Basic Metabolic Panel: Recent Labs  Lab 10/25/21 0912 10/25/21 1923 10/26/21 0028 10/27/21 0408  NA 129* 128* 129* 132*  K 4.8 5.1 4.4 4.1  CL 93* 99 100 100  CO2 16* 18* 21* 25  GLUCOSE 170* 147* 155* 90  BUN 25* 30* 29* 19  CREATININE 2.68* 2.28* 1.90* 1.11  CALCIUM 7.8* 6.6* 6.7* 7.2*  MG  --   --   --  1.4*  PHOS  --   --   --  1.6*   GFR: Estimated Creatinine Clearance: 87.2 mL/min (by C-G formula based on SCr of 1.11 mg/dL). Liver Function Tests: Recent Labs  Lab 10/25/21 0912 10/26/21 0028 10/27/21 0408  AST 334* 162* 77*  ALT 161* 90* 51*  ALKPHOS 113 78 58  BILITOT 3.1* 1.7* 2.1*  PROT 6.9 5.3* 4.6*  ALBUMIN 3.5 2.4* 2.1*   Recent Labs  Lab 10/25/21 0912 10/26/21 0028 10/27/21 0408  LIPASE 897* 337* 129*   No results for input(s): AMMONIA in the last 168 hours. Coagulation Profile: Recent Labs  Lab 10/25/21 1425  INR 0.9   Cardiac Enzymes: No results for input(s): CKTOTAL, CKMB, CKMBINDEX, TROPONINI in the last 168 hours. BNP (last 3 results) No results for input(s): PROBNP in the last 8760 hours. HbA1C: Recent Labs    10/25/21 1339  HGBA1C 5.5   CBG: Recent Labs  Lab 10/27/21 0719 10/27/21 0826 10/27/21 1017 10/27/21 1347 10/27/21 1649  GLUCAP 107* 105* 105* 109* 118*   Lipid Profile: Recent Labs    10/25/21 1339 10/25/21 1923 10/26/21 0028 10/27/21 0408  CHOL 173 147 131 83  HDL <10* <10* <10* 11*  LDLCALC UNABLE TO CALCULATE IF TRIGLYCERIDE OVER 400 mg/dL UNABLE TO CALCULATE IF TRIGLYCERIDE OVER 400 mg/dL NOT CALCULATED 48  TRIG 915* 756* 634* 119  CHOLHDL NOT CALCULATED NOT CALCULATED NOT CALCULATED 7.5   LDLDIRECT 13.6 14.6  --   --    Thyroid Function Tests: No results for input(s): TSH, T4TOTAL, FREET4, T3FREE, THYROIDAB in the last 72 hours. Anemia Panel: No results for input(s): VITAMINB12, FOLATE, FERRITIN, TIBC, IRON, RETICCTPCT in the last 72 hours. Sepsis Labs: Recent Labs  Lab 10/25/21 1023 10/25/21 1923 10/25/21 2329  LATICACIDVEN 4.5* 1.6 1.3    Recent Results (from the past 240 hour(s))  Culture, blood (routine x 2)  Status: None (Preliminary result)   Collection Time: 10/25/21 11:47 AM   Specimen: BLOOD RIGHT FOREARM  Result Value Ref Range Status   Specimen Description BLOOD RIGHT FOREARM  Final   Special Requests   Final    BOTTLES DRAWN AEROBIC AND ANAEROBIC Blood Culture adequate volume   Culture   Final    NO GROWTH 2 DAYS Performed at [facility], [address] [place], [place] [address]    Report Status PENDING  Incomplete  Resp Panel by RT-PCR (Flu A&B, Covid) Nasopharyngeal Swab     Status: None   Collection Time: 10/25/21 12:43 PM   Specimen: Nasopharyngeal Swab; Nasopharyngeal(NP) swabs in vial transport medium  Result Value Ref Range Status   SARS Coronavirus 2 by RT PCR NEGATIVE NEGATIVE Final    Comment: (NOTE) SARS-CoV-2 target nucleic acids are NOT DETECTED.  The SARS-CoV-2 RNA is generally detectable in upper respiratory specimens during the acute phase of infection. The lowest concentration of SARS-CoV-2 viral copies this assay can detect is 138 copies/mL. A negative result does not preclude SARS-Cov-2 infection and should not be used as the sole basis for treatment or other patient management decisions. A negative result may occur with  improper specimen collection/handling, submission of specimen other than nasopharyngeal swab, presence of viral mutation(s) within the areas targeted by this assay, and inadequate number of viral copies(<138 copies/mL). A negative result must be combined with clinical observations, patient  history, and epidemiological information. The expected result is Negative.  Fact Sheet for Patients:  BloggerCourse.com  Fact Sheet for Healthcare Providers:  SeriousBroker.it  This test is no t yet approved or cleared by the [place] FDA and  has been authorized for detection and/or diagnosis of SARS-CoV-2 by FDA under an Emergency Use Authorization (EUA). This EUA will remain  in effect (meaning this test can be used) for the duration of the COVID-19 declaration under Section 564(b)(1) of the Act, 21 U.S.C.section 360bbb-3(b)(1), unless the authorization is terminated  or revoked sooner.       Influenza A by PCR NEGATIVE NEGATIVE Final   Influenza B by PCR NEGATIVE NEGATIVE Final    Comment: (NOTE) The Xpert Xpress SARS-CoV-2/FLU/RSV plus assay is intended as an aid in the diagnosis of influenza from Nasopharyngeal swab specimens and should not be used as a sole basis for treatment. Nasal washings and aspirates are unacceptable for Xpert Xpress SARS-CoV-2/FLU/RSV testing.  Fact Sheet for Patients: BloggerCourse.com  Fact Sheet for Healthcare Providers: SeriousBroker.it  This test is not yet approved or cleared by the [place] FDA and has been authorized for detection and/or diagnosis of SARS-CoV-2 by FDA under an Emergency Use Authorization (EUA). This EUA will remain in effect (meaning this test can be used) for the duration of the COVID-19 declaration under Section 564(b)(1) of the Act, 21 U.S.C. section 360bbb-3(b)(1), unless the authorization is terminated or revoked.  Performed at [facility], [address] [place], [place] [address]     Radiology Studies: No results found.  Scheduled Meds:  chlordiazePOXIDE  25 mg Oral Q8H   Followed by   [START ON 10/28/2021] chlordiazePOXIDE  25 mg Oral BH-qamhs   Followed by   Melene Muller ON 10/29/2021]  chlordiazePOXIDE  25 mg Oral Daily   folic acid  1 mg Oral Daily   LORazepam  0-4 mg Intravenous Q4H   Followed by   Melene Muller ON 10/28/2021] LORazepam  0-4 mg Intravenous Q8H   multivitamin with minerals  1 tablet Oral  Daily   nicotine  21 mg Transdermal Daily   pantoprazole  40 mg Oral Daily   thiamine  250 mg Oral Daily   vitamin B-12  1,000 mcg Oral Daily   Continuous Infusions:  calcium gluconate     dextrose 5 % and 0.9% NaCl 100 mL/hr at 10/27/21 1834    LOS: 2 days   Marguerita Merles, DO [facility] Available via Epic secure chat 7am-7pm After these hours, please refer to coverage provider listed on [email or web address] 10/27/2021, 6:37 PM

## 2021-10-27 NOTE — Assessment & Plan Note (Signed)
-  Patient's Mag Level is 1.4 -Replete with IV Mag Sulfate 4 grams -Continue to Monitor and Replete as Necessary -Repeat Mag Level in the AM

## 2021-10-28 DIAGNOSIS — W19XXXA Unspecified fall, initial encounter: Secondary | ICD-10-CM

## 2021-10-28 LAB — CBC WITH DIFFERENTIAL/PLATELET
Abs Immature Granulocytes: 0 10*3/uL (ref 0.00–0.07)
Basophils Absolute: 0 10*3/uL (ref 0.0–0.1)
Basophils Relative: 0 %
Eosinophils Absolute: 0 10*3/uL (ref 0.0–0.5)
Eosinophils Relative: 0 %
HCT: 36.7 % — ABNORMAL LOW (ref 39.0–52.0)
Hemoglobin: 12.8 g/dL — ABNORMAL LOW (ref 13.0–17.0)
Lymphocytes Relative: 4 %
Lymphs Abs: 0.1 10*3/uL — ABNORMAL LOW (ref 0.7–4.0)
MCH: 35.1 pg — ABNORMAL HIGH (ref 26.0–34.0)
MCHC: 34.9 g/dL (ref 30.0–36.0)
MCV: 100.5 fL — ABNORMAL HIGH (ref 80.0–100.0)
Monocytes Absolute: 0.4 10*3/uL (ref 0.1–1.0)
Monocytes Relative: 10 %
Neutro Abs: 3.1 10*3/uL (ref 1.7–7.7)
Neutrophils Relative %: 86 %
Platelets: 46 10*3/uL — ABNORMAL LOW (ref 150–400)
RBC: 3.65 MIL/uL — ABNORMAL LOW (ref 4.22–5.81)
RDW: 14.6 % (ref 11.5–15.5)
Smear Review: DECREASED
WBC: 3.6 10*3/uL — ABNORMAL LOW (ref 4.0–10.5)
nRBC: 0 % (ref 0.0–0.2)

## 2021-10-28 LAB — MAGNESIUM: Magnesium: 2 mg/dL (ref 1.7–2.4)

## 2021-10-28 LAB — COMPREHENSIVE METABOLIC PANEL
ALT: 38 U/L (ref 0–44)
AST: 55 U/L — ABNORMAL HIGH (ref 15–41)
Albumin: 2.2 g/dL — ABNORMAL LOW (ref 3.5–5.0)
Alkaline Phosphatase: 52 U/L (ref 38–126)
Anion gap: 7 (ref 5–15)
BUN: 7 mg/dL (ref 6–20)
CO2: 25 mmol/L (ref 22–32)
Calcium: 7.6 mg/dL — ABNORMAL LOW (ref 8.9–10.3)
Chloride: 100 mmol/L (ref 98–111)
Creatinine, Ser: 0.94 mg/dL (ref 0.61–1.24)
GFR, Estimated: >60 mL/min (ref >60)
Glucose, Bld: 109 mg/dL — ABNORMAL HIGH (ref 70–99)
Potassium: 3.6 mmol/L (ref 3.5–5.1)
Sodium: 132 mmol/L — ABNORMAL LOW (ref 135–145)
Total Bilirubin: 3.1 mg/dL — ABNORMAL HIGH (ref 0.3–1.2)
Total Protein: 4.8 g/dL — ABNORMAL LOW (ref 6.5–8.1)

## 2021-10-28 LAB — LIPID PANEL
Cholesterol: 83 mg/dL (ref 0–200)
HDL: 12 mg/dL — ABNORMAL LOW (ref >40)
LDL Cholesterol: 49 mg/dL (ref 0–99)
Total CHOL/HDL Ratio: 6.9 RATIO
Triglycerides: 110 mg/dL (ref <150)
VLDL: 22 mg/dL (ref 0–40)

## 2021-10-28 LAB — GLUCOSE, CAPILLARY: Glucose-Capillary: 115 mg/dL — ABNORMAL HIGH (ref 70–99)

## 2021-10-28 LAB — PHOSPHORUS: Phosphorus: 1.6 mg/dL — ABNORMAL LOW (ref 2.5–4.6)

## 2021-10-28 LAB — LIPASE, BLOOD: Lipase: 74 U/L — ABNORMAL HIGH (ref 11–51)

## 2021-10-28 MED ORDER — SODIUM PHOSPHATES 45 MMOLE/15ML IV SOLN
30.0000 mmol | Freq: Once | INTRAVENOUS | Status: AC
  Administered 2021-10-28: 30 mmol via INTRAVENOUS
  Filled 2021-10-28 (×2): qty 10

## 2021-10-28 NOTE — Evaluation (Signed)
Physical Therapy Evaluation ?Patient Details ?Name: Stephen GrayerJoshua Bender ?MRN: [id number] ?DOB: 12/27/1975 ?Today's Date: 10/28/2021 ? ?History of Present Illness ? Pt is a 46 yo M [facility] for new onset of epigastric pain radiating to the back and N/V likely due to pancreatitis, per notes. PMH includes alcohol abuse, recurrent alcholic pancreatitis, thrombocytopenia, and tobacco use.  ?Clinical Impression ? [facility] with epgastric pain radiating to his back and N/V consistent with patient's diagnosis of pancreatitis. Pt's impairments include decreased strength, power, coordination, sequencing, and balance with mobility. These impairments are limiting his ability to safely and independently transfer, ambulate, and navigate stairs. Patient requires min guard to min Ax2 with all functional mobility. Pt impulsive and easily distracted by his lines and leads during session requiring cues to redirect attention to tasks at hand. Pt had multiple LOB with ambulating that required min Ax2 to recover. Pt desats to 85% on RA with mobility. In supine, pt placed on 3L O2 via [place] and sats increased to >90%. RN notified. SPT educated pt in LE strengthening exercises in supine. SPT recommending home health PT vs outpatient PT pending pt progression upon D/C due to mobility deficits. SPT recommending a RW for pt to use to increase stability with ambulation. PT will continue to follow acutely to maximize pt's safety and independence with functional mobility. ?   ?   ? ?Recommendations for follow up therapy are one component of a multi-disciplinary discharge planning process, led by the attending physician.  Recommendations may be updated based on patient status, additional functional criteria and insurance authorization. ? ?Follow Up Recommendations Other (comment) (Home health PT vs Outpatient PT pending progression) ? ?  ?Assistance Recommended at Discharge Frequent or constant  Supervision/Assistance  ?Patient can return home with the following ? A little help with bathing/dressing/bathroom;Assistance with cooking/housework;Direct supervision/assist for medications management;Assist for transportation;Help with stairs or ramp for entrance;A little help with walking and/or transfers ? ?  ?Equipment Recommendations Rolling walker (2 wheels)  ?Recommendations for Other Services ?    ?  ?Functional Status Assessment Patient has had a recent decline in their functional status and demonstrates the ability to make significant improvements in function in a reasonable and predictable amount of time.  ? ?  ?Precautions / Restrictions Precautions ?Precautions: Fall ?Restrictions ?Weight Bearing Restrictions: No  ? ?  ? ?Mobility ? Bed Mobility ?Overal bed mobility: Needs Assistance ?Bed Mobility: Supine to Sit, Sit to Supine ?  ?  ?Supine to sit: Min guard (needed cues for LE managment) ?Sit to supine: Min guard ?  ?General bed mobility comments: pt required increased cues for bed mobility tasks, however, required min guard for safety with tasks. ?  ? ?Transfers ?Overall transfer level: Needs assistance ?Equipment used: None ?Transfers: Sit to/from Stand ?Sit to Stand: Min assist, +2 safety/equipment ?  ?  ?  ?  ?  ?General transfer comment: pt required min A for steadying with task ?  ? ?Ambulation/Gait ?Ambulation/Gait assistance: Min assist, +2 physical assistance, +2 safety/equipment ?Gait Distance (Feet): 100 Feet ?Assistive device: None ?Gait Pattern/deviations: Step-through pattern, Decreased stride length, Trunk flexed, Narrow base of support ?Gait velocity: unsafe speed given deficits ?Gait velocity interpretation: 1.31 - 2.62 ft/sec, indicative of limited community ambulator ?Pre-gait activities: marching in place ?General Gait Details: pt with anterior lean with gait and several LOB requiring min Ax2 to steady. He required continuous cues for safe speed and safety during  ambulation. ? ?Stairs ?  ?  ?  ?  ?  ? ?  Wheelchair Mobility ?  ? ?Modified Rankin (Stroke Patients Only) ?  ? ?  ? ?Balance Overall balance assessment: Needs assistance ?  ?  ?  ?Postural control: Other (comment) (forward trunk lean) ?Standing balance support: No upper extremity supported ?Standing balance-Leahy Scale: Poor ?Standing balance comment: pt requiring min Ax2 for steadying when LOB during ambulation. Pt had increased sway and bilateral shakiness in BLE. ?  ?  ?  ?  ?  ?  ?  ?  ?Standardized Balance Assessment ?Standardized Balance Assessment : Dynamic Gait Index ?  ?Dynamic Gait Index ?Gait with Horizontal Head Turns: Moderate Impairment ?Gait with Vertical Head Turns: Moderate Impairment ?   ? ? ? ?Pertinent Vitals/Pain Pain Assessment ?Pain Assessment: Faces ?Faces Pain Scale: Hurts a little bit ?Pain Location: abdomen ?Pain Descriptors / Indicators: Discomfort ?Pain Intervention(s): Limited activity within patient's tolerance, Monitored during session  ? ? ?Home Living Family/patient expects to be discharged to:: Private residence ?Living Arrangements: Parent ?Available Help at Discharge: Family;Available 24 hours/day ?Type of Home: House ?Home Access: Stairs to enter ?Entrance Stairs-Rails: None ?Entrance Stairs-Number of Steps: 3 ?  ?Home Layout: One level ?Home Equipment: Grab bars - toilet;Grab bars - tub/shower;Shower seat - built in;Cane - single point;Crutches ?Additional Comments: pt to live with his parents after D/C.  ?  ?Prior Function Prior Level of Function : Driving;Independent/Modified Independent ?  ?  ?  ?  ?  ?  ?Mobility Comments: did not use AD ?ADLs Comments: able to bathe, dress, and feed himself ?  ? ? ?Hand Dominance  ?   ? ?  ?Extremity/Trunk Assessment  ? Upper Extremity Assessment ?Upper Extremity Assessment: Defer to OT evaluation ?  ? ?Lower Extremity Assessment ?Lower Extremity Assessment: Generalized weakness ?  ? ?Cervical / Trunk Assessment ?Cervical / Trunk  Assessment: Normal  ?Communication  ? Communication: No difficulties  ?Cognition   ?Behavior During Therapy: Flat affect, Impulsive ?  ?  ?  ?  ?  ?  ?  ?  ?  ?  ?  ?  ?  ?  ?  ?  ?  ?General Comments: pt impulsive with mobility and had a flat affect throughout. His mom spoke over and corrected him when SPT obtaining home living from pt. Unsure if this is baseline cognition for pt or due to his lethargy. Pt easily distracted from task at hand with lines and leads. Constantly holding them or moving them. He never attempted to pull at them. ?  ?  ? ?  ?General Comments General comments (skin integrity, edema, etc.): pt desats following mobility to 85% on RA. Pt sats rose to 92% on 3L O2 via [place] once supine in bed. Required cues for pursed lip breathing. RN notified. ? ?  ?Exercises Total Joint Exercises ?Straight Leg Raises: Both, Strengthening, Other (comment) (2 reps bilaterally. Notable shakiness with task and pt complaints of discomfort in abdomen) ?Bridges: Strengthening, Supine (4 reps with mild hip clearance from bed. Pt legs notably shaky with task)  ? ?Assessment/Plan  ?  ?PT Assessment Patient needs continued PT services  ?PT Problem List Decreased strength;Decreased activity tolerance;Decreased balance;Decreased mobility;Decreased coordination;Decreased knowledge of use of DME;Decreased safety awareness;Decreased knowledge of precautions;Cardiopulmonary status limiting activity;Pain ? ?   ?  ?PT Treatment Interventions DME instruction;Gait training;Stair training;Functional mobility training;Therapeutic activities;Therapeutic exercise;Neuromuscular re-education;Balance training;Patient/family education   ? ?PT Goals (Current goals can be found in the Care Plan section)  ?Acute Rehab PT Goals ?Patient Stated Goal: to go home ?  PT Goal Formulation: With patient/family ?Time For Goal Achievement: 11/11/21 ?Potential to Achieve Goals: Good ? ?  ?Frequency Min 3X/week ?  ? ? ?Co-evaluation   ?  ?  ?  ?  ? ? ?   ?AM-PAC PT "6 Clicks" Mobility  ?Outcome Measure Help needed turning from your back to your side while in a flat bed without using bedrails?: None ?Help needed moving from lying on your back to sitting on the side of a flat bed without using bedrails

## 2021-10-28 NOTE — Progress Notes (Signed)
?PROGRESS NOTE ? ? ? Stephen Bender  [id number] DOB: 22-Mar-1976 DOA: 10/25/2021 ?PCP: Pcp, No  ? ?Brief Narrative:  ?HPI per Dr. Wynetta Fines ?Stephen Bender is a 46 y.o. male with medical history significant of alcohol abuse, recurrent alcoholic pancreatitis, came with new onset of epigastric pain, feeling nauseous and vomiting. ?  ?Patient was hospitalized in May [id number] for alcoholic pancreatitis underwent MRCP which showed no CBD etiology.  Patient was treated and stabilized and discharged home and since then patient has been staying away from alcohol until 1 week ago, patient picked up drinking again.  2-3 beers a day and last drink was Thursday night.  Monday morning, patient started to feel epigastric pain, cramping-like, radiating to the back, along with severe nauseous vomiting of stomach content, he has not been able to eat or drink since yesterday morning.  Denies any fever chills no diarrhea. ?  ?ED Course: Patient was found to have hypotensive and tachycardia, afebrile. ?  ?Blood pressure responded to IV bolus total of 2000 mL.  CT abdomen pelvis showed severe pancreatitis.  Small volume of abdominal pelvic ascites and small volume of bilateral pleural effusions.  Hepatic steatosis and hepatomegaly. ?  ?Blood work showed AKI creatinine 2.6 BUN 25, bicarb 16, K4.8, sodium 129.  Hemoconcentration of hemoglobin 21.5> 19.9.  WBC 7.3.  AST 334, ALT 161, total bili 3.1.  Lactic acid 4.5. ?  ?**Interim History ?Patient's Abdominal Pain is improving and TG and Lipase Level trending down. GI consulted and recommending continuing Fluid. He is starting to withdraw more so will escalate CIWA protocol to SDU monitoring and a Librium Taper was placed. LFTS, Renal Fxn and T Bili improving along with TG. LA resolved.  ? ?Since his lipid profile is improved we will stop his insulin drip and his CBG monitoring.  We will continue IV fluid hydration he was given additional 1 L bolus today. Will continue to Monitor and reduce IVF  to 100 mL/hr x 1 day.  ? ?IVF Hydration is now stopped and his Renal Fxn and LFTs are improving. Phos was a little low so will replete with IV Sodium Phos 30 mmol. Diet is being advanced to FULL Liquid and then will change to SOFT tonight. T Bili is still somewhat elevated and slightly worsened to 3.1.   ? ? ?Assessment and Plan: ?* Pancreatitis ?-Likely Multifactorial as Patient has Alcohol Abuse Hx and because his TG were >900 ?-BISAP=3, which considered to be severe, will admit to PCU for close monitoring. ?-Initially NPO but now on CLD per GI and will continue for now with Diet Advancement per them ?-Morphine for pain  ?-CT abd reviewed, no CBD etiology, and given the correlation of recurrent alcohol abuse and pancreatitis, will not repeat MRCP this time.   ?-Lipid Panel checked and showed evaded triglycerides on admission at 915 is now trended on 634 -> 119 -> 110 ?-Given another dose of IV fluid boluses of normal saline 1 L and will continue D5 normal saline at 150 MLS/hr and reduce to 100 mL/hr  ?-GI consulted and appreciate further recommendations and they recommend continue aggressive IV fluid resuscitation  ?-Lipase level is trending down we will repeat in the morning; Repeat Lipase Level is now 74 ? ?Fall ?-Had a fall overnight ?-PT/OT to evalaute and Treat ?-Overnight NP notified fall and Nursing evaluated and patient was without injury ?-Continue to Monitor  ? ?Hypophosphatemia ?-Patient's Phos Level is now 1.6 ?-Replete with IV Sodium Phos 30 mmol again ?-Continue to  Monitor and Replete as Necessary ?-Repeat Phos Level in the AM  ? ?Hypomagnesemia ?-Patient's Mag Level was 1.4 and improved to 2.0 ?-Replete with IV Mag Sulfate 4 grams yesterday  ?-Continue to Monitor and Replete as Necessary ?-Repeat Mag Level in the AM  ? ?Lactic acidosis ?-Patient's LA went from 4.5 -> 1.3 and resolved ?-Continue to Monitor and Trend ? ?Abnormal LFTs ?Hyperbilirubinemia  ?- In the setting of alcoholic pancreatitis.   Patient's AST is now trended down and went from 334 and is now 162 -> 77 -> 74 and patient's ALT is now trended down from 161 is now 90 -> 51 -> 55 ?-Does not qualify for prednisolone based on moderate discriminant score ?-Bilirubin was trending down but now went back up as it went from 3.1 -> 1.7 -> 2.1 -> 3.1 ?-Continue to monitor Hepatic Fxn Panel and may obtain RUQ U/S  ? ?Hyponatremia ?-Na+ is now gone from 129 -> 128 -> 129 -> 132 x2 ?-Continue with IV fluid hydration as delineated ?-Continue to monitor and trend and repeat CMP in the a.m. ? ?Alcohol withdrawal ([place]) ?-Increased to the stepdown unit CIWA monitoring protocol and will give scheduled lorazepam ?-Continue folic acid, thiamine, multivitamin and will consider given his increased agitation and hallucinations ?-Since he worsened overnight he was started on Librium Taper ? ?Hyperglycemia ?-Likely reactive and will continue monitor and repeat hemoglobin A1c in the morning to evaluate for diabetes and he does not have any and it was 5.5 ?-Insulin gtt stopped  ?-If necessary will place on sensitive NovoLog/scale insulin AC ? ?Hypocalcemia ?- Patient's calcium was 7.6 this AM  ?-Was given a dose of calcium gluconate 1 g again yesterday  ? ?AKI (acute kidney injury) ([place]) ?Metabolic Acidosis ?-Patient's BUNs/creatinine on admission was 25/2.68 -> 29/1.90 -> 7/0.94; continue with D5 normal saline but reduced from 150 MLS per hour to 100 mL/hr x1 more day we will also give and repeat 1 L normal saline bolus ?-Patient's CO2 is now 25, anion gap is 7, chloride level is now 100; much improved since admission ?-Continue to monitor and avoid nephrotoxic medications, contrast dyes, hypotension and and dehydration and renally adjust medications ?-Repeat CMP in the a.m. ? ? ?Hypertriglyceridemia ?- Likely contributing to his acute pancreatitis and is trending down with last triglyceride level 634 and has now improved to 119 ?-Stop Insulin gtt ?-Repeat triglyceride  level in the morning ? ?Thrombocytopenia ([place]) ?- In the setting of his alcoholism and splenic sequestration; patient's platelet count has dropped from admission is now 35 -> 33 -> 46 we will need to continue monitor for signs and symptoms of bleeding; currently no overt bleeding noted ? ?DVT prophylaxis: SCDs Start: 10/25/21 1248 ? ?  Code Status: Full Code ?Family Communication: Discussed with parents and girlfriedn at bedside ? ?Disposition Plan:  ?Level of care: Progressive ?Status is: Inpatient ?Remains inpatient appropriate because:   ? ?Consultants:  ?Gastroenterology  ? ?Procedures:  ?None ? ?Antimicrobials:  ?Anti-infectives (From admission, onward)  ? ? None  ? ?  ?  ?Subjective: ?Seen and examined at bedside and he was doing fairly well and doing much improved.  Not withdrawing as much.  Had a fall overnight.  Denies any nausea or vomiting.  Denies any lightheadedness or dizziness and still has some tremors but they are improving. ? ?Objective: ?Vitals:  ? 10/28/21 0439 10/28/21 0650 10/28/21 0717 10/28/21 0719  ?BP: 126/87 (!) 130/92 (!) 138/93   ?Pulse: (!) 102 (!) 101 (!) 104 95  ?  Resp: 20 20 (!) 24   ?Temp: 98 ?F (36.7 ?C) 98 ?F (36.7 ?C)    ?TempSrc: Oral Oral    ?SpO2: 94% 94% 94%   ?Weight:      ?Height:      ? ? ?Intake/Output Summary (Last 24 hours) at 10/28/2021 1544 ?Last data filed at 10/27/2021 2100 ?Gross per 24 hour  ?Intake 2346.7 ml  ?Output 800 ml  ?Net 1546.7 ml  ? ?Filed Weights  ? 10/25/21 0816  ?Weight: 72.6 kg  ? ?Examination: ?Physical Exam: ? ?Constitutional: The patient is a thin Caucasian male currently no acute distress appears a little anxious ?Eyes: Mild scleral icterus ?Respiratory: Diminished to auscultation bilaterally, no wheezing, rales, rhonchi or crackles. Normal respiratory effort and patient is not tachypenic. No accessory muscle use.  Unlabored breathing ?Cardiovascular: Tachycardic rate but regular rhythm, no murmurs / rubs / gallops. S1 and S2 auscultated. No  extremity edema.  ?Abdomen: Soft, non-tender, non-distended.  Bowel sounds positive.  ?GU: Deferred. ?Musculoskeletal: No clubbing / cyanosis of digits/nails. No joint deformity upper and lower extremities.

## 2021-10-28 NOTE — Plan of Care (Signed)
  Problem: Education: Goal: Knowledge of General Education information will improve Description: Including pain rating scale, medication(s)/side effects and non-pharmacologic comfort measures Outcome: Progressing   Problem: Clinical Measurements: Goal: Ability to maintain clinical measurements within normal limits will improve Outcome: Progressing Goal: Will remain free from infection Outcome: Progressing   Problem: Activity: Goal: Risk for activity intolerance will decrease Outcome: Progressing   Problem: Nutrition: Goal: Adequate nutrition will be maintained Outcome: Progressing   Problem: Coping: Goal: Level of anxiety will decrease Outcome: Progressing   Problem: Safety: Goal: Ability to remain free from injury will improve Outcome: Progressing   

## 2021-10-28 NOTE — Progress Notes (Signed)
? ? ?  OVERNIGHT PROGRESS REPO ? ?Remote location notification. ? ? Received remote location notification from nursing that  "Pt family member at bedside brought to our attention that pt fell. We inspected him thoroughly without any injury. Gave him PRN medication and monitoring pt."  ?This was not witnessed by nursing staff. ? ?Patient is resting at this time. ?Patient is A&O x4 per nursing assessment and has no obvious or stated injuries. ?Patient has no anticoagulants. ? ? ?Chinita Greenland MSNA MSN ACNPC-AG ?Acute Care Nurse Practitioner ?[facility] ?[place] ? ? ? ?

## 2021-10-28 NOTE — Assessment & Plan Note (Signed)
-  Had a fall overnight ?-PT/OT to evalaute and Treat ?-Overnight NP notified fall and Nursing evaluated and patient was without injury ?-Continue to Monitor  ?

## 2021-10-29 ENCOUNTER — Inpatient Hospital Stay (HOSPITAL_COMMUNITY): Payer: Self-pay

## 2021-10-29 LAB — COMPREHENSIVE METABOLIC PANEL
ALT: 37 U/L (ref 0–44)
AST: 54 U/L — ABNORMAL HIGH (ref 15–41)
Albumin: 2.2 g/dL — ABNORMAL LOW (ref 3.5–5.0)
Alkaline Phosphatase: 60 U/L (ref 38–126)
Anion gap: 8 (ref 5–15)
BUN: <5 mg/dL — ABNORMAL LOW (ref 6–20)
CO2: 30 mmol/L (ref 22–32)
Calcium: 7.9 mg/dL — ABNORMAL LOW (ref 8.9–10.3)
Chloride: 97 mmol/L — ABNORMAL LOW (ref 98–111)
Creatinine, Ser: 0.79 mg/dL (ref 0.61–1.24)
GFR, Estimated: >60 mL/min (ref >60)
Glucose, Bld: 106 mg/dL — ABNORMAL HIGH (ref 70–99)
Potassium: 3 mmol/L — ABNORMAL LOW (ref 3.5–5.1)
Sodium: 135 mmol/L (ref 135–145)
Total Bilirubin: 1.9 mg/dL — ABNORMAL HIGH (ref 0.3–1.2)
Total Protein: 4.9 g/dL — ABNORMAL LOW (ref 6.5–8.1)

## 2021-10-29 LAB — LIPASE, BLOOD: Lipase: 81 U/L — ABNORMAL HIGH (ref 11–51)

## 2021-10-29 LAB — CBC WITH DIFFERENTIAL/PLATELET
Abs Immature Granulocytes: 0.05 10*3/uL (ref 0.00–0.07)
Basophils Absolute: 0 10*3/uL (ref 0.0–0.1)
Basophils Relative: 1 %
Eosinophils Absolute: 0.1 10*3/uL (ref 0.0–0.5)
Eosinophils Relative: 2 %
HCT: 36.1 % — ABNORMAL LOW (ref 39.0–52.0)
Hemoglobin: 12.5 g/dL — ABNORMAL LOW (ref 13.0–17.0)
Immature Granulocytes: 1 %
Lymphocytes Relative: 13 %
Lymphs Abs: 0.5 10*3/uL — ABNORMAL LOW (ref 0.7–4.0)
MCH: 35.2 pg — ABNORMAL HIGH (ref 26.0–34.0)
MCHC: 34.6 g/dL (ref 30.0–36.0)
MCV: 101.7 fL — ABNORMAL HIGH (ref 80.0–100.0)
Monocytes Absolute: 1 10*3/uL (ref 0.1–1.0)
Monocytes Relative: 25 %
Neutro Abs: 2.3 10*3/uL (ref 1.7–7.7)
Neutrophils Relative %: 58 %
Platelets: 63 10*3/uL — ABNORMAL LOW (ref 150–400)
RBC: 3.55 MIL/uL — ABNORMAL LOW (ref 4.22–5.81)
RDW: 14.2 % (ref 11.5–15.5)
WBC: 4 10*3/uL (ref 4.0–10.5)
nRBC: 0 % (ref 0.0–0.2)

## 2021-10-29 LAB — MAGNESIUM: Magnesium: 1.9 mg/dL (ref 1.7–2.4)

## 2021-10-29 LAB — PHOSPHORUS: Phosphorus: 3.6 mg/dL (ref 2.5–4.6)

## 2021-10-29 MED ORDER — FUROSEMIDE 10 MG/ML IJ SOLN
40.0000 mg | Freq: Once | INTRAMUSCULAR | Status: AC
  Administered 2021-10-29: 40 mg via INTRAVENOUS
  Filled 2021-10-29: qty 4

## 2021-10-29 MED ORDER — ADULT MULTIVITAMIN W/MINERALS CH: 1.0000 | ORAL_TABLET | Freq: Every day | ORAL | 0 refills | Status: AC

## 2021-10-29 MED ORDER — CHLORDIAZEPOXIDE HCL 10 MG PO CAPS: 10.0000 mg | ORAL_CAPSULE | Freq: Three times a day (TID) | ORAL | 0 refills | Status: AC | PRN

## 2021-10-29 MED ORDER — POTASSIUM CHLORIDE CRYS ER 20 MEQ PO TBCR
40.0000 meq | EXTENDED_RELEASE_TABLET | Freq: Once | ORAL | Status: AC
  Administered 2021-10-29: 40 meq via ORAL

## 2021-10-29 MED ORDER — PANTOPRAZOLE SODIUM 40 MG PO TBEC: 40.0000 mg | DELAYED_RELEASE_TABLET | Freq: Every day | ORAL | 0 refills | Status: AC

## 2021-10-29 MED ORDER — FENOFIBRATE 145 MG PO TABS: 145.0000 mg | ORAL_TABLET | Freq: Every day | ORAL | 0 refills | Status: AC

## 2021-10-29 MED ORDER — POTASSIUM CHLORIDE CRYS ER 20 MEQ PO TBCR
40.0000 meq | EXTENDED_RELEASE_TABLET | Freq: Once | ORAL | Status: AC
  Administered 2021-10-29: 40 meq via ORAL
  Filled 2021-10-29 (×2): qty 2

## 2021-10-29 NOTE — Discharge Summary (Signed)
? ?Physician Discharge Summary  ?Stephen Bender TIW:[id number] DOB: 02-Jun-1976 DOA: 10/25/2021 ? ?PCP: Pcp, No ? ?Admit date: 10/25/2021 ?Discharge date: 10/29/2021 ? ?Admitted From: home ?Disposition:  home ? ?Recommendations for Outpatient Follow-up:  ?Follow up with PCP in 1-2 weeks ?Please obtain BMP/CBC in one week ? ?Home Health: none ?Equipment/Devices: none ? ?Discharge Condition: stable ?CODE STATUS: Full code ?Diet recommendation: low fat ? ?HPI: Per admitting MD, ?Stephen Bender is a 46 y.o. male with medical history significant of alcohol abuse, recurrent alcoholic pancreatitis, came with new onset of epigastric pain, feeling nauseous and vomiting. Patient was hospitalized in May 2022 for alcoholic pancreatitis underwent MRCP which showed no CBD etiology.  Patient was treated and stabilized and discharged home and since then patient has been staying away from alcohol until 1 week ago, patient picked up drinking again.  2-3 beers a day and last drink was Thursday night.  Monday morning, patient started to feel epigastric pain, cramping-like, radiating to the back, along with severe nauseous vomiting of stomach content, he has not been able to eat or drink since yesterday morning.  Denies any fever chills no diarrhea. ? ?Hospital Course / Discharge diagnoses: ?Principal Problem: ?  Pancreatitis ?Active Problems: ?  Acute pancreatitis ?  Thrombocytopenia ([facility]) ?  Hypertriglyceridemia ?  AKI (acute kidney injury) ([facility]) ?  Hypocalcemia ?  Hyperglycemia ?  Alcohol withdrawal ([facility]) ?  Hyponatremia ?  Abnormal LFTs ?  Lactic acidosis ?  Hypomagnesemia ?  Hypophosphatemia ?  Fall ? ? ?Assessment and Plan: ?Principal problem ?Acute alcoholic pancreatitis -there is also multifactorial component due to hypertriglyceridemia as well as EtOH use.  He was treated with IV fluids, n.p.o., conservative management with improvement in his clinical condition.  His pain has resolved and he is now tolerating a regular diet.  He will be  discharged home in stable condition.  He is determined to quit drinking ? ?Active problems ?Hypokalemia, hypomagnesemia, hypophosphatemia-repleted ?Lactic acidosis -Patient's LA went from 4.5 -> 1.3 and resolved ?Abnormal LFTs, hyperbilirubinemia - In the setting of alcohol use  ?Hyponatremia -resolved ?Alcohol withdrawal (HCC) -triggered CIWA initially but now improving, [place].  His significant withdrawals appears to be resolving, will be discharged on a short course of Librium ?Hyperglycemia -Likely reactive, required insulin drip initially but now CBGs are stable off insulin.  A1c was 5.5 ?Hypocalcemia -repleted ?AKI (acute kidney injury) (HCC) -resolved with fluids ?Hypertriglyceridemia - Likely contributing to his acute pancreatitis and is trending down with last triglyceride level 634 and has now improved to 119.  Start Tricor ?Thrombocytopenia (HCC) - In the setting of his alcoholism and splenic sequestration.  Improving ? ?Sepsis ruled out ? ? ?Discharge Instructions ? ? ?Allergies as of 10/29/2021   ?No Known Allergies ?  ? ?  ?Medication List  ?  ? ?TAKE these medications   ? ?chlordiazePOXIDE 10 MG capsule ?Commonly known as: LIBRIUM ?Take 1 capsule (10 mg total) by mouth 3 (three) times daily as needed for anxiety or withdrawal. ?  ?fenofibrate 145 MG tablet ?Commonly known as: Tricor ?Take 1 tablet (145 mg total) by mouth daily. ?  ?multivitamin with minerals Tabs tablet ?Take 1 tablet by mouth daily. ?Start taking on: October 30, 2021 ?  ?pantoprazole 40 MG tablet ?Commonly known as: Protonix ?Take 1 tablet (40 mg total) by mouth daily. ?  ?VISINE OP ?Place 1 drop into both eyes daily as needed (dry eyes). ?  ? ?  ? ? Follow-up Information   ? ? Go  to  [facility] EMERGENCY DEPARTMENT.   ?Specialty: Emergency Medicine ?Why: If symptoms worsen ?Contact information: ?[address] ?[id number] mc ?[place] [address] ?[phone] ? ?  ?  ? ? Call  [facility].   ?Why: Call to schedule an appointment to establish care ?Contact information: ?[address] ?[place] [address] ?[phone] ? ?  ?  ? ?  ?  ? ?  ? ? ?Consultations: ?none ? ?Procedures/Studies: ? ?CT ABDOMEN PELVIS WO CONTRAST ? ?Result Date: 10/25/2021 ?CLINICAL DATA:  Acute, severe pancreatitis. Alcohol abuse. Thrombocytopenia. EXAM: CT ABDOMEN AND PELVIS WITHOUT CONTRAST TECHNIQUE: Multidetector CT imaging of the abdomen and pelvis was performed following the standard protocol without IV contrast. RADIATION DOSE REDUCTION: This exam was performed according to the departmental dose-optimization program which includes automated exposure control, adjustment of the mA and/or kV according to patient size and/or use of iterative reconstruction technique. COMPARISON:  Ultrasound earlier today. Abdominal MRI 12/30/2020. CT 11/13/2016 FINDINGS: Lower chest: Clear lung bases. Normal heart size without pericardial or pleural effusion. Hepatobiliary: Marked hepatic steatosis, with mild hepatomegaly at 18.1 cm craniocaudal. No calcified gallstones.  No biliary duct dilatation. Pancreas: Marked peripancreatic edema with fluid throughout the anterior pararenal space, extending into the omentum and along the paracolic gutters. Limited evaluation for more well defined fluid collection, given lack of oral or IV contrast. Spleen: Normal in size, without focal abnormality. Adrenals/Urinary Tract: Normal adrenal glands. Horseshoe kidney, without hydronephrosis or bladder stone. Stomach/Bowel: The antro pyloric region is displaced anteriorly by the peripancreatic fluid. No gastric outlet obstruction. Otherwise normal small bowel. Normal terminal ileum and appendix. Vascular/Lymphatic: Normal caliber of the aorta and branch vessels. No abdominopelvic adenopathy. Reproductive: Normal prostate. Other: Small volume abdominopelvic ascites. Musculoskeletal: No acute osseous  abnormality. IMPRESSION: 1. Severe pancreatitis. Limited evaluation for focal fluid collection given lack of IV contrast. 2. Small volume abdominopelvic ascites. 3. Hepatic steatosis and hepatomegaly. 4. Horseshoe kidney. Electronically Signed   By: Jeronimo Greaves M.D.   On: 10/25/2021 11:36  ? ?US Abdomen Complete ? ?Result Date: 10/25/2021 ?CLINICAL DATA:  Abdominal pain and vomiting for several days. Acute pancreatitis. EXAM: ABDOMEN ULTRASOUND COMPLETE COMPARISON:  12/30/2020 FINDINGS: Gallbladder: No gallstones or wall thickening visualized. No sonographic Murphy sign noted by sonographer. Common bile duct: Diameter: 5 mm, within normal limits. Liver: Diffusely increased echogenicity of the hepatic parenchyma, consistent with hepatic steatosis. No hepatic mass identified. Portal vein is patent on color Doppler imaging with normal direction of blood flow towards the liver. IVC: No abnormality visualized. Pancreas: Visualized portion of the pancreatic head and body appears prominent with mild peripancreatic fluid, highly suspicious for acute pancreatitis. Spleen: Size and appearance within normal limits. Right Kidney: Length: 8.8 cm. Echogenicity within normal limits. No mass or hydronephrosis visualized. Left Kidney: Length: 8.4 cm. Limited visualization due to patient habitus and immobility. Echogenicity within normal limits. No mass or hydronephrosis visualized. Abdominal aorta: No aneurysm visualized. Other findings: Small amount of free fluid seen in Morison's pouch and the left upper quadrant. IMPRESSION: Probable acute pancreatitis, with small amount of peripancreatic and bilateral upper quadrant fluid. No evidence of gallstones or biliary ductal dilatation. Hepatic steatosis. Electronically Signed   By: Danae Orleans M.D.   On: 10/25/2021 11:07  ? ?DG CHEST PORT 1 VIEW ? ?Result Date: 10/29/2021 ?CLINICAL DATA:  Dyspnea EXAM: PORTABLE CHEST 1 VIEW COMPARISON:  10/25/2021 FINDINGS: Transverse diameter of heart  is increased. There is  interval appearance of moderate bilateral pleural effusions, more so on the left side. Possibility of underlying infiltrates in the lower lung fields is not excluded. There is no pneum

## 2021-10-29 NOTE — Evaluation (Signed)
Occupational Therapy Evaluation ?Patient Details ?Name: Stephen Bender ?MRN: [id number] ?DOB: 07/06/1976 ?Today's Date: 10/29/2021 ? ? ?History of Present Illness Pt is a 46 yo M presenting to [facility] on 10/25/21 for new onset of epigastric pain radiating to the back and N/V likely due to pancreatitis, per notes. PMH includes alcohol abuse, recurrent alcholic pancreatitis, thrombocytopenia, and tobacco use.  ? ?Clinical Impression ?  ?Pt reports independence at baseline with ADLs and functional mobility, does not use AD at baseline and lives with parents who can provide assistance at d/c. Pt at a set up  - supervision level with ADLs, supervision for bed mobility and transfers. Overall VSS on RA, tachycardic with HR in 130's after walking to bed from bathroom. Pt presenting with impairments listed below, will follow. Recommend d/c home with assistance.  ?   ? ?Recommendations for follow up therapy are one component of a multi-disciplinary discharge planning process, led by the attending physician.  Recommendations may be updated based on patient status, additional functional criteria and insurance authorization.  ? ?Follow Up Recommendations ? No OT follow up  ?  ?Assistance Recommended at Discharge PRN  ?Patient can return home with the following A little help with walking and/or transfers;A little help with bathing/dressing/bathroom;Assistance with cooking/housework;Help with stairs or ramp for entrance ? ?  ?Functional Status Assessment ? Patient has had a recent decline in their functional status and demonstrates the ability to make significant improvements in function in a reasonable and predictable amount of time.  ?Equipment Recommendations ? None recommended by OT  ?  ?Recommendations for Other Services   ? ? ?  ?Precautions / Restrictions Precautions ?Precautions: Fall ?Restrictions ?Weight Bearing Restrictions: No  ? ?  ? ?Mobility Bed Mobility ?Overal bed mobility: Needs Assistance ?Bed Mobility: Supine to Sit, Sit  to Supine ?  ?  ?Supine to sit: Supervision ?Sit to supine: Supervision ?  ?  ?  ? ?Transfers ?Overall transfer level: Needs assistance ?Equipment used: None ?Transfers: Sit to/from Stand ?Sit to Stand: Supervision ?  ?  ?  ?  ?  ?  ?  ? ?  ?Balance Overall balance assessment: Needs assistance ?Sitting-balance support: Feet supported, No upper extremity supported ?Sitting balance-Leahy Scale: Fair ?  ?  ?Standing balance support: No upper extremity supported ?Standing balance-Leahy Scale: Good ?Standing balance comment: ambulates to bathroom without AD, able to manage tele leads during ambulation ?  ?  ?  ?  ?  ?  ?  ?  ?  ?  ?  ?   ? ?ADL either performed or assessed with clinical judgement  ? ?ADL Overall ADL's : Needs assistance/impaired ?Eating/Feeding: Set up;Sitting ?  ?Grooming: Set up;Sitting ?  ?Upper Body Bathing: Set up;Sitting ?  ?Lower Body Bathing: Set up;Sitting/lateral leans ?  ?Upper Body Dressing : Set up;Sitting ?  ?Lower Body Dressing: Set up;Sitting/lateral leans ?Lower Body Dressing Details (indicate cue type and reason): reports donning pants and socks indepednently ?Toilet Transfer: Retail banker;Ambulation ?  ?Toileting- Clothing Manipulation and Hygiene: Supervision/safety;Sitting/lateral lean ?  ?  ?  ?Functional mobility during ADLs: Supervision/safety ?   ? ? ? ?Vision   ?Vision Assessment?: No apparent visual deficits  ?   ?Perception   ?  ?Praxis   ?  ? ?Pertinent Vitals/Pain Pain Assessment ?Pain Assessment: No/denies pain  ? ? ? ?Hand Dominance   ?  ?Extremity/Trunk Assessment Upper Extremity Assessment ?Upper Extremity Assessment: Overall WFL for tasks assessed ?  ?Lower Extremity Assessment ?Lower Extremity  Assessment: Defer to PT evaluation ?  ?Cervical / Trunk Assessment ?Cervical / Trunk Assessment: Normal ?  ?Communication Communication ?Communication: No difficulties ?  ?Cognition Arousal/Alertness: Awake/alert ?Behavior During Therapy: Flat affect,  Impulsive ?Overall Cognitive Status: Within Functional Limits for tasks assessed ?  ?  ?  ?  ?  ?  ?  ?  ?  ?  ?  ?  ?  ?  ?  ?  ?  ?  ?  ?General Comments  SpO2 in 90's throughout session ? ?  ?Exercises   ?  ?Shoulder Instructions    ? ? ?Home Living Family/patient expects to be discharged to:: Private residence ?Living Arrangements: Parent ?Available Help at Discharge: Family;Available 24 hours/day ?Type of Home: House ?Home Access: Stairs to enter ?Entrance Stairs-Number of Steps: 3 ?Entrance Stairs-Rails: None ?Home Layout: One level ?  ?  ?Bathroom Shower/Tub: Walk-in shower ?  ?Bathroom Toilet: Handicapped height ?Bathroom Accessibility: Yes ?  ?Home Equipment: Grab bars - toilet;Grab bars - tub/shower;Shower seat - built in;Cane - single point;Crutches ?  ?Additional Comments: pt to live with his parents after D/C. ?  ? ?  ?Prior Functioning/Environment Prior Level of Function : Driving;Independent/Modified Independent ?  ?  ?  ?  ?  ?  ?Mobility Comments: did not use AD ?ADLs Comments: able to bathe, dress, and feed himself ?  ? ?  ?  ?OT Problem List: Decreased strength;Decreased range of motion;Decreased activity tolerance;Impaired balance (sitting and/or standing);Decreased safety awareness ?  ?   ?OT Treatment/Interventions: Self-care/ADL training;Therapeutic exercise;Therapeutic activities;Patient/family education;Balance training;Energy conservation;DME and/or AE instruction  ?  ?OT Goals(Current goals can be found in the care plan section) Acute Rehab OT Goals ?Patient Stated Goal: to go home ?OT Goal Formulation: With patient ?Time For Goal Achievement: 11/12/21 ?Potential to Achieve Goals: Good ?ADL Goals ?Pt Will Perform Upper Body Dressing: with modified independence;standing;sitting ?Pt Will Perform Lower Body Dressing: with modified independence;sit to/from stand;sitting/lateral leans ?Pt Will Transfer to Toilet: with modified independence;ambulating;regular height toilet ?Pt Will Perform  Tub/Shower Transfer: with modified independence;Tub transfer;Shower transfer;ambulating  ?OT Frequency: Min 2X/week ?  ? ?Co-evaluation   ?  ?  ?  ?  ? ?  ?AM-PAC OT "6 Clicks" Daily Activity     ?Outcome Measure Help from another person eating meals?: None ?Help from another person taking care of personal grooming?: None ?Help from another person toileting, which includes using toliet, bedpan, or urinal?: None ?Help from another person bathing (including washing, rinsing, drying)?: A Little ?Help from another person to put on and taking off regular upper body clothing?: None ?Help from another person to put on and taking off regular lower body clothing?: A Little ?6 Click Score: 22 ?  ?End of Session Nurse Communication: Mobility status ? ?Activity Tolerance: Patient tolerated treatment well ?Patient left: in bed;with call bell/phone within reach;with bed alarm set;with family/visitor present ? ?OT Visit Diagnosis: Unsteadiness on feet (R26.81);Other abnormalities of gait and mobility (R26.89);Muscle weakness (generalized) (M62.81)  ?              ?Time: 1610-9604 ?OT Time Calculation (min): 15 min ?Charges:  OT General Charges ?$OT Visit: 1 Visit ?OT Evaluation ?$OT Eval Low Complexity: 1 Low ? ?Alfonzo Beers, OTD, OTR/L ?Acute Rehab ?[phone] ? ?Stephen Bender ?10/29/2021, 12:25 PM ?

## 2021-10-29 NOTE — Progress Notes (Signed)
Discharge summary packet provided to Pt's Mother(Kathy) with instructions. Olegario Messier verbalized understanding of instructions.  Pt d/c to home as ordered. Pt remains alert/oriented in no apparent distress. No complaints voiced. Pt's mom is responsible for pt's transportation. ?

## 2021-10-29 NOTE — Progress Notes (Signed)
Physical Therapy Treatment ?Patient Details ?Name: Stephen Bender ?MRN: [id number] ?DOB: [birth year] ?Today's Date: 10/29/2021 ? ? ?History of Present Illness Pt is a 46 yo M presenting to [facility] on 10/25/21 for new onset of epigastric pain radiating to the back and N/V likely due to pancreatitis, per notes. PMH includes alcohol abuse, recurrent alcholic pancreatitis, thrombocytopenia, and tobacco use. ? ?  ?PT Comments  ? ? Pt making steady progress towards goals requiring min assist throughout for safety with transfers and ambulation as pt with tendency toward impulsivity resulting in multiple LOB, pt able to self correct in all instances with min assist. Pt O2 sats stable >92% throughout ambulation on RA. Pt needling max cues throughout to slow down and steady self before ambulating. Pt continues to benefit from skilled PT services to progress toward functional mobility goals.  ?  ?Recommendations for follow up therapy are one component of a multi-disciplinary discharge planning process, led by the attending physician.  Recommendations may be updated based on patient status, additional functional criteria and insurance authorization. ? ?Follow Up Recommendations ? Other (comment) (Home health PT vs Outpatient PT pending progression) ?  ?  ?Assistance Recommended at Discharge Frequent or constant Supervision/Assistance  ?Patient can return home with the following A little help with bathing/dressing/bathroom;Assistance with cooking/housework;Direct supervision/assist for medications management;Assist for transportation;Help with stairs or ramp for entrance;A little help with walking and/or transfers ?  ?Equipment Recommendations ? Rolling walker (2 wheels)  ?  ?Recommendations for Other Services   ? ? ?  ?Precautions / Restrictions Precautions ?Precautions: Fall ?Restrictions ?Weight Bearing Restrictions: No  ?  ? ?Mobility ? Bed Mobility ?Overal bed mobility: Needs Assistance ?Bed Mobility: Supine to Sit, Sit to Supine ?  ?   ?Supine to sit: Min guard (needed cues for LE managment) ?Sit to supine: Min guard ?  ?General bed mobility comments: pmin guard for safety secondary to impulsivity. ?  ? ?Transfers ?Overall transfer level: Needs assistance ?Equipment used: None ?Transfers: Sit to/from Stand ?Sit to Stand: Min guard, Min assist ?  ?  ?  ?  ?  ?General transfer comment: min assist to steady on rise, max cues to come to standing first then ambulate, pt impulsive and turning around as soon as standing with mild LOB, pt able to correct with min assist ?  ? ?Ambulation/Gait ?Ambulation/Gait assistance: Min assist ?Gait Distance (Feet): 200 Feet ?Assistive device: None ?Gait Pattern/deviations: Step-through pattern, Decreased stride length, Trunk flexed, Narrow base of support ?Gait velocity: unsafe speed given deficits, max cues to slow down, look ahead as pt with LOB when looking around, pt able to correct with min assist ?  ?  ?General Gait Details: pt with anterior lean with gait and several LOB requiring min Ax2 to steady. He required continuous cues for safe speed and safety during ambulation. ? ? ?Stairs ?  ?  ?  ?  ?  ? ? ?Wheelchair Mobility ?  ? ?Modified Rankin (Stroke Patients Only) ?  ? ? ?  ?Balance Overall balance assessment: Needs assistance ?Sitting-balance support: Feet supported, No upper extremity supported ?Sitting balance-Leahy Scale: Fair ?  ?Postural control: Other (comment) (forward trunk lean) ?Standing balance support: No upper extremity supported ?Standing balance-Leahy Scale: Poor ?Standing balance comment: pt requiring min A for steadying when LOB during ambulation. ?  ?  ?  ?  ?  ?  ?  ?  ?  ?  ?  ?  ? ?  ?Cognition Arousal/Alertness: Awake/alert (Groggy) ?Behavior During Therapy:  Flat affect, Impulsive ?Overall Cognitive Status: Impaired/Different from baseline ?  ?  ?  ?  ?  ?  ?  ?  ?  ?  ?  ?  ?  ?  ?  ?  ?General Comments: pt very impulsive with mobility, max cues throughout to slow down, ?  ?  ? ?   ?Exercises   ? ?  ?General Comments   ?  ?  ? ?Pertinent Vitals/Pain Pain Assessment ?Pain Assessment: No/denies pain  ? ? ?Home Living   ?  ?  ?  ?  ?  ?  ?  ?  ?  ?   ?  ?Prior Function    ?  ?  ?   ? ?PT Goals (current goals can now be found in the care plan section) Acute Rehab PT Goals ?Patient Stated Goal: to go home ?PT Goal Formulation: With patient/family ?Time For Goal Achievement: 11/11/21 ? ?  ?Frequency ? ? ? Min 3X/week ? ? ? ?  ?PT Plan    ? ? ?Co-evaluation   ?  ?  ?  ?  ? ?  ?AM-PAC PT "6 Clicks" Mobility   ?Outcome Measure ? Help needed turning from your back to your side while in a flat bed without using bedrails?: None ?Help needed moving from lying on your back to sitting on the side of a flat bed without using bedrails?: A Little ?Help needed moving to and from a bed to a chair (including a wheelchair)?: A Little ?Help needed standing up from a chair using your arms (e.g., wheelchair or bedside chair)?: A Little ?Help needed to walk in hospital room?: A Little ?Help needed climbing 3-5 steps with a railing? : A Lot ?6 Click Score: 18 ? ?  ?End of Session Equipment Utilized During Treatment: Gait belt ?Activity Tolerance: Patient limited by lethargy ?Patient left: in bed;with call bell/phone within reach;with bed alarm set;with family/visitor present ?Nurse Communication: Mobility status;Other (comment) (O2 sats) ?PT Visit Diagnosis: Unsteadiness on feet (R26.81);Other abnormalities of gait and mobility (R26.89);Muscle weakness (generalized) (M62.81);Difficulty in walking, not elsewhere classified (R26.2);Pain;Other symptoms and signs involving the nervous system (R29.898) ?Pain - part of body:  (abdomen) ?  ? ? ?Time: 7341-9379 ?PT Time Calculation (min) (ACUTE ONLY): 20 min ? ?Charges:  $Therapeutic Activity: 8-22 mins          ?          ? ?Lenora Boys. PTA ?Acute Rehabilitation Services ?Office: [phone] ? ? ? ?Marlana Salvage Segundo Makela ?10/29/2021, 10:07 AM ? ?

## 2021-10-29 NOTE — Progress Notes (Signed)
SATURATION QUALIFICATIONS: (This note is used to comply with regulatory documentation for home oxygen) ? ?Patient Saturations on Room Air at Rest = 96% ? ?Patient Saturations on Room Air while Ambulating = 94% ? ? ?Please briefly explain why patient needs home oxygen: Pt not desating on RA. No supplemental O2 needs this session.  ?

## 2021-10-30 LAB — CULTURE, BLOOD (ROUTINE X 2)
Culture: NO GROWTH
Special Requests: ADEQUATE

## 2022-02-21 IMAGING — CT CT NECK W/ CM
3 of 4 series · 12 of 33 positions shown, 14 images · IV contrast (omnipaque)
Comparison: None.

CLINICAL DATA: 46-year-old male with toothache for several days.
Progressed pain and swelling this morning.

EXAM:
CT NECK WITH CONTRAST
TECHNIQUE: Multidetector CT imaging of the neck was performed using the
standard protocol following the bolus administration of intravenous
contrast.
CONTRAST:  75mL OMNIPAQUE IOHEXOL 350 MG/ML SOLN

[Series 3: neck 2.0 st · axial · 0.49mm/px · z∈[-218,-18]mm · 4 of 151 slices shown, 5 images]
[im 26/151  soft-tissue]
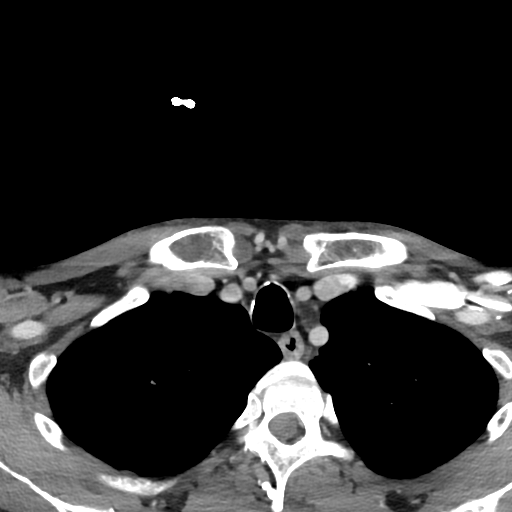
[im 26/151  bone]
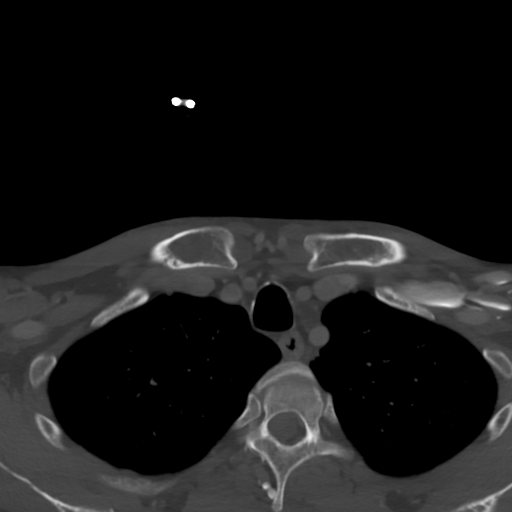
[im 51/151  bone]
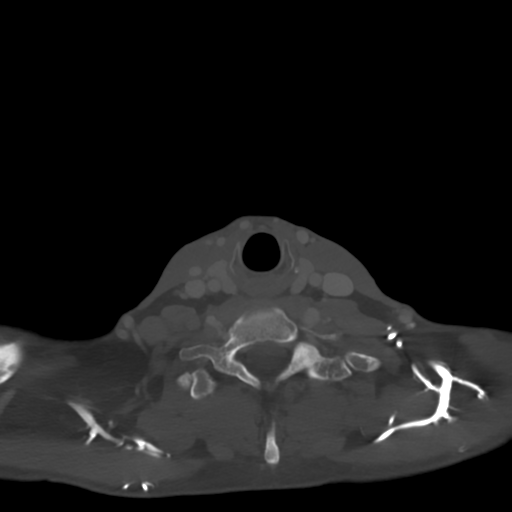
[im 101/151  bone]
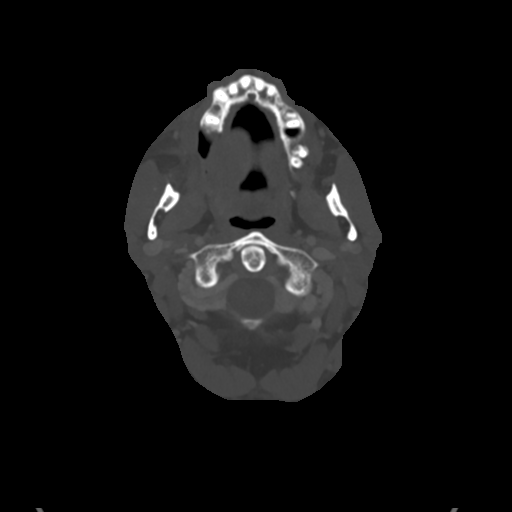
[im 126/151  bone]
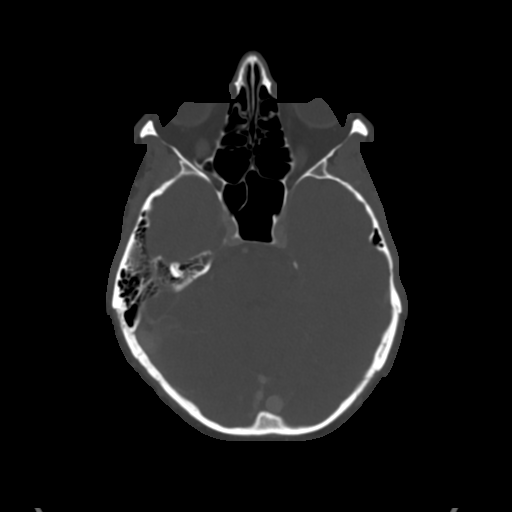

[Series 4: sagittal · sagittal · 0.43mm/px · 5 of 101 slices shown, 6 images]
[im 34/101  bone]
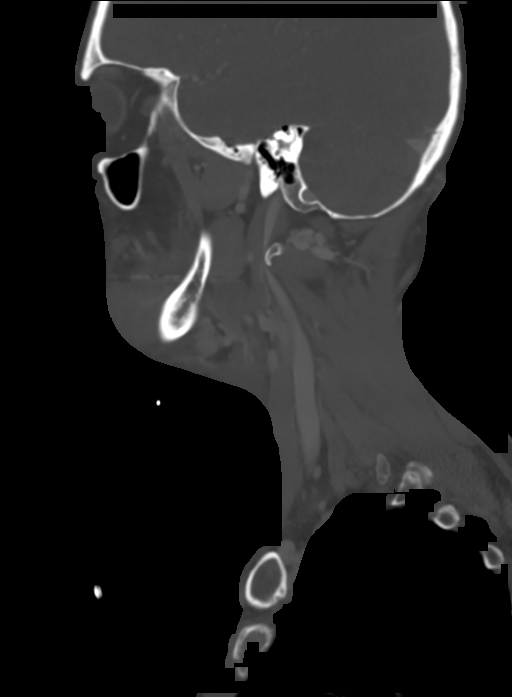
[im 42/101  bone]
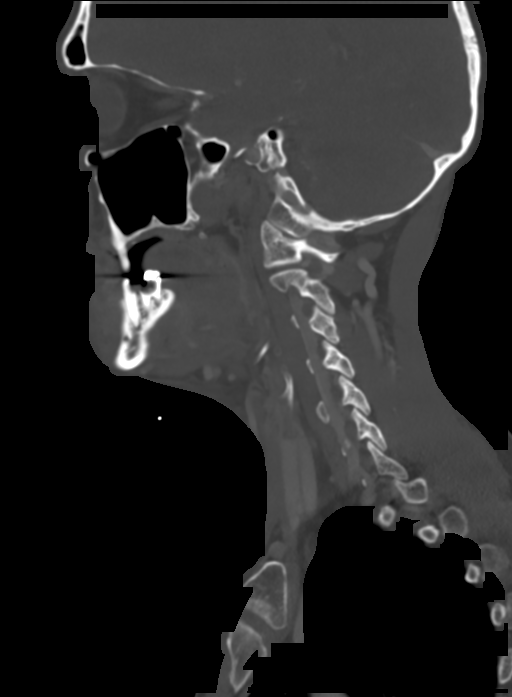
[im 51/101  soft-tissue]
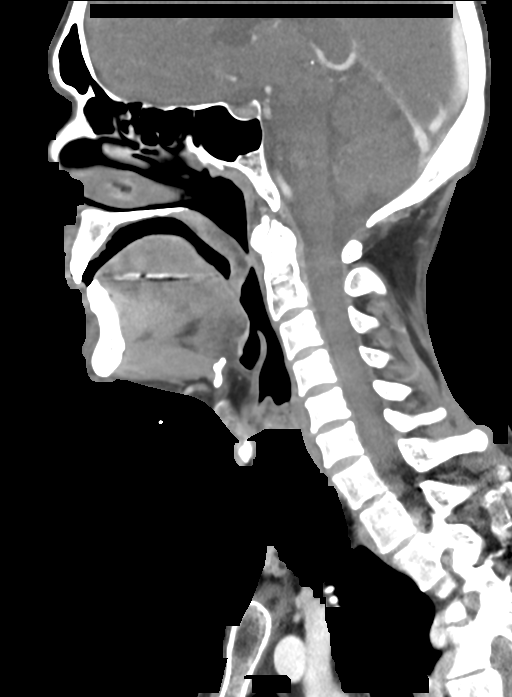
[im 51/101  bone]
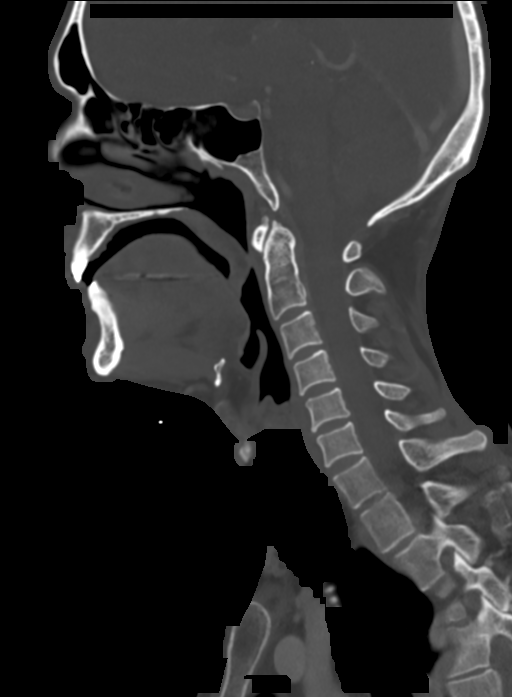
[im 59/101  bone]
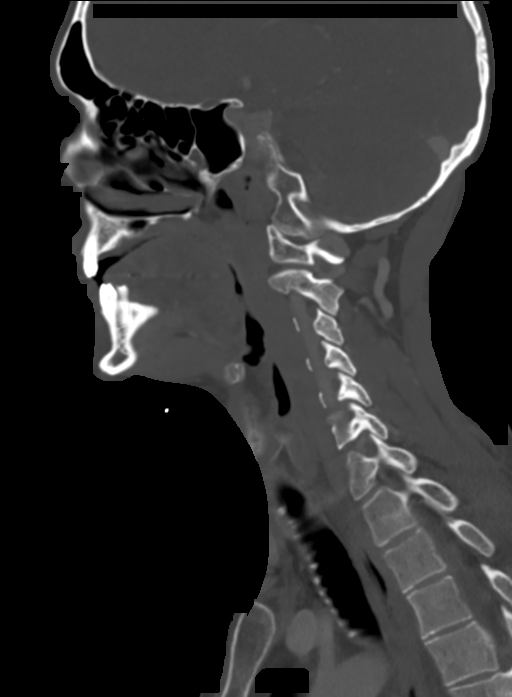
[im 67/101  bone]
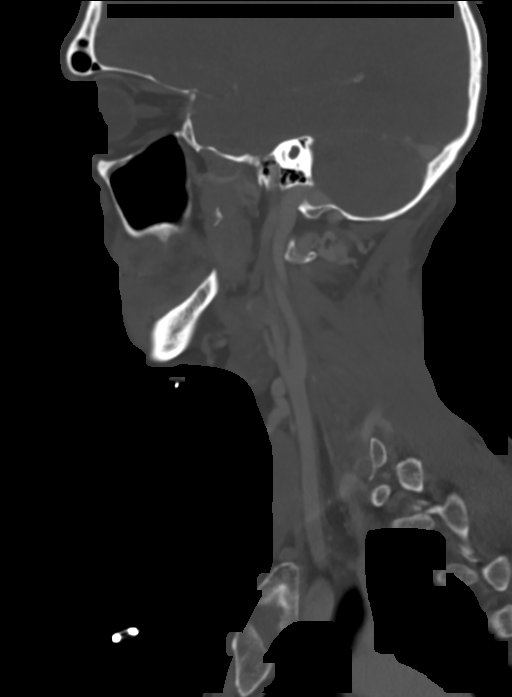

[Series 5: coronal · coronal · 0.37mm/px · 3 of 108 slices shown]
[im 22/108  bone]
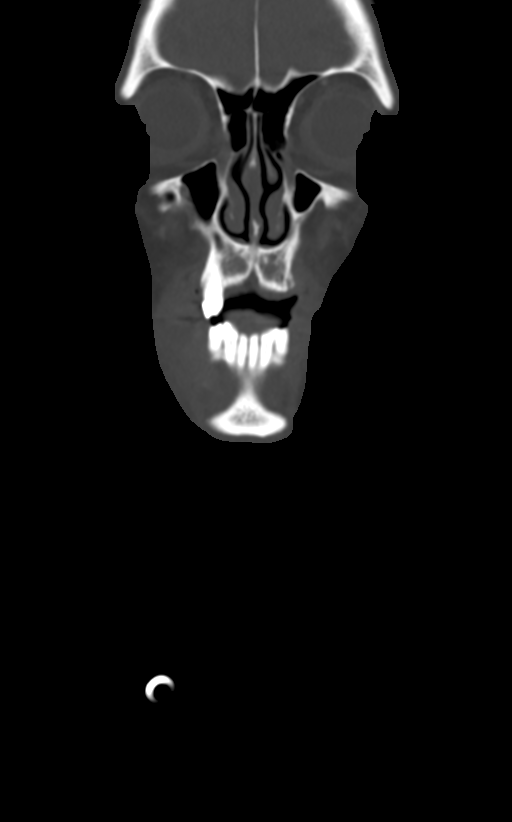
[im 43/108  bone]
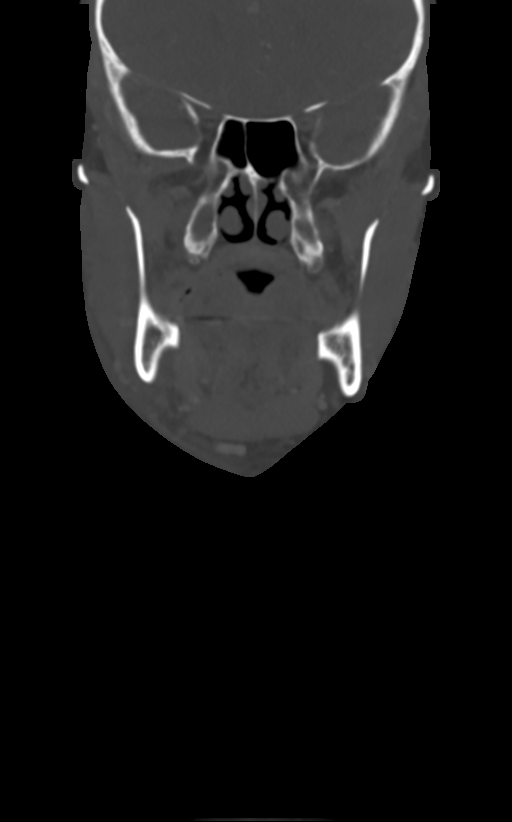
[im 65/108  bone]
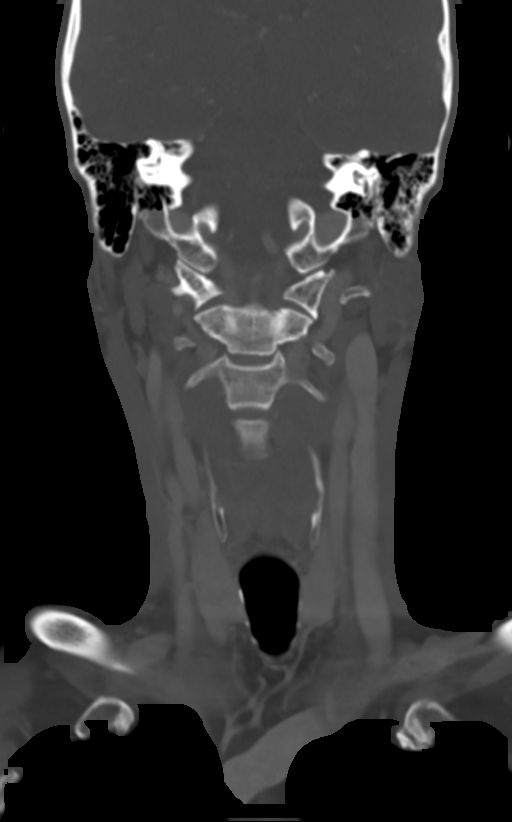

[12 of 33 positions shown; findings below may reference images not displayed]

FINDINGS: Pharynx and larynx: The glottis is closed. Otherwise normal larynx
and pharynx. Parapharyngeal and retropharyngeal spaces are normal.

Salivary glands: Sublingual space, left submandibular and parotid
spaces are normal. Left masticator space is normal.

Inflammation in the lower right masticator and right submandibular
spaces with thickening of the platysma and overlying superficial fat
stranding, cellulitis. Inflammation continues to the submental
region on the right. Mild mass effect on the right submandibular
gland. But no gland hyperenhancement. No soft tissue gas or abscess
identified.

The right parotid space and upper right masticator space remain
normal.

Thyroid: Negative.

Lymph nodes: Hyperenhancing, mildly enlarged reactive right level 1
and level 2 lymph nodes. The largest is a right level B node
measuring 7 mm short axis on series 3, image 73. No cystic or
necrotic nodes.

Vascular: Major vascular structures in the neck and at the skull
base are patent including the right IJ and retromandibular veins.
Left vertebral artery appears dominant. No atherosclerosis.

Limited intracranial: Negative.

Visualized orbits: Negative.

Mastoids and visualized paranasal sinuses: Clear.

Skeleton: Poor dentition. On the right side there is periapical
lucency at the carious residual right mandible molar. But no
discrete cortical dehiscence. Carious adjacent posterior right
mandible bicuspid. No other acute osseous abnormality identified.

Upper chest: Negative.
IMPRESSION: Odontogenic Cellulitis and inflammation in the right masticator and
submandibular spaces. But no abscess. Reactive right level 1 and
level 2 lymph nodes. Underlying poor dentition.

## 2022-04-26 IMAGING — US US ABDOMEN COMPLETE
1 series · 13 of 25 positions shown · non-contrast
Comparison: 12/30/2020

CLINICAL DATA: Abdominal pain and vomiting for several days. Acute
pancreatitis.

EXAM:
ABDOMEN ULTRASOUND COMPLETE

[Series 1: us abdomen complete · 13 of 149 slices shown]
[im 1/149]
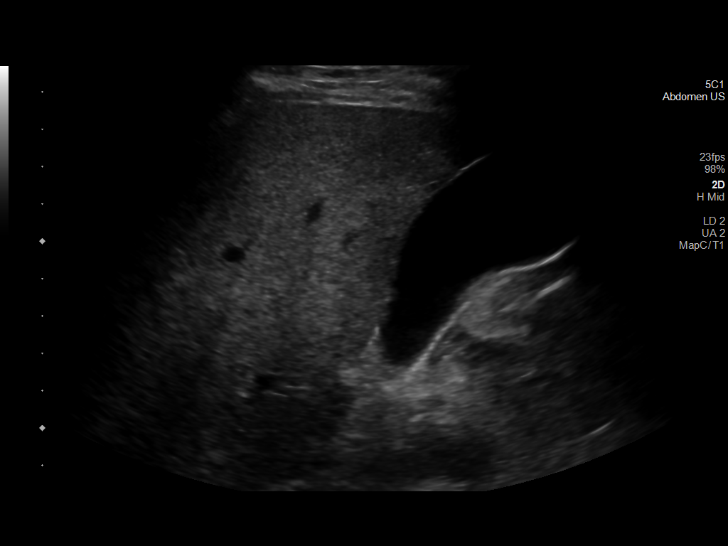
[im 13/149]
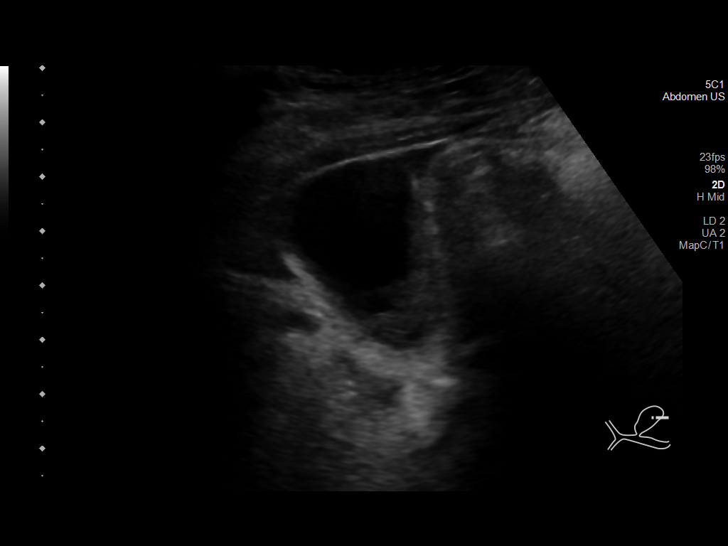
[im 25/149]
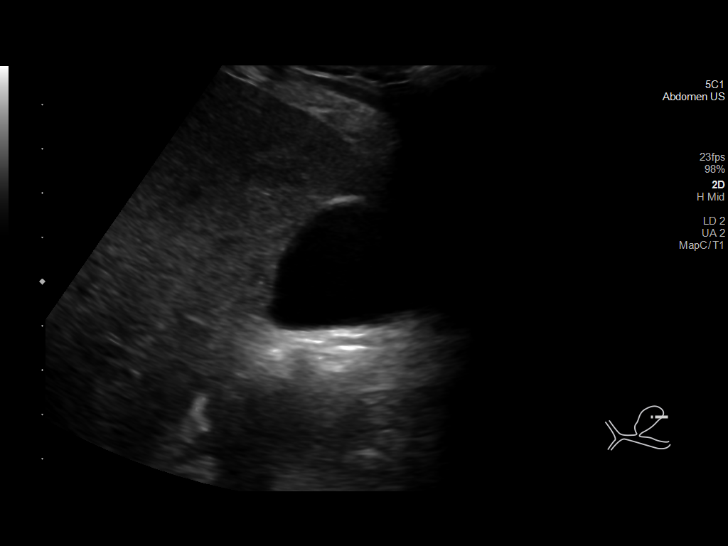
[im 38/149]
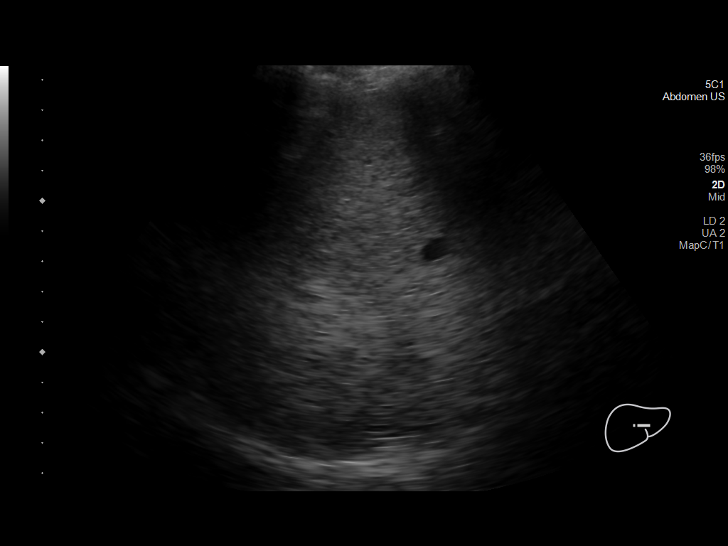
[im 50/149]
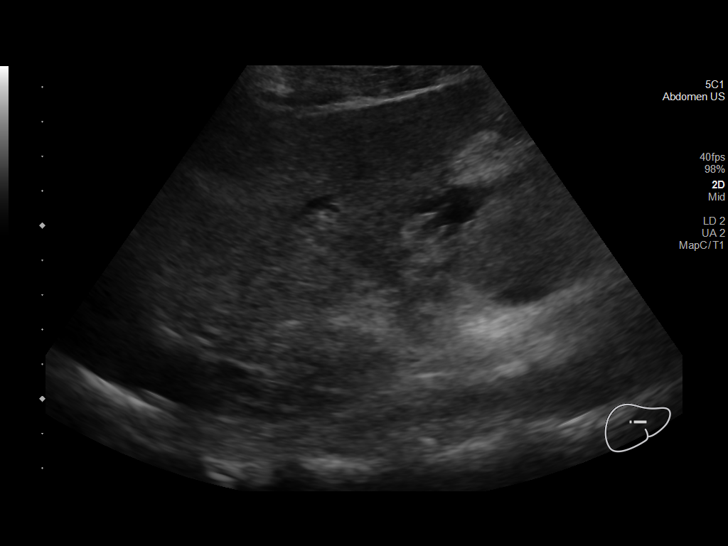
[im 62/149]
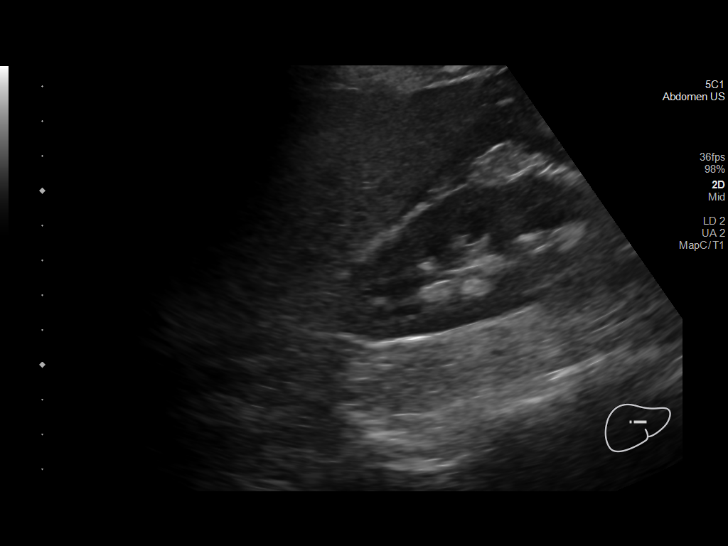
[im 75/149]
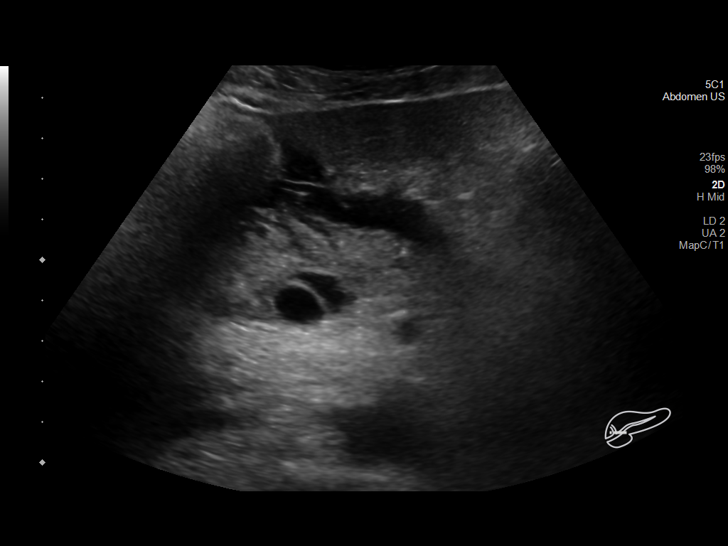
[im 87/149]
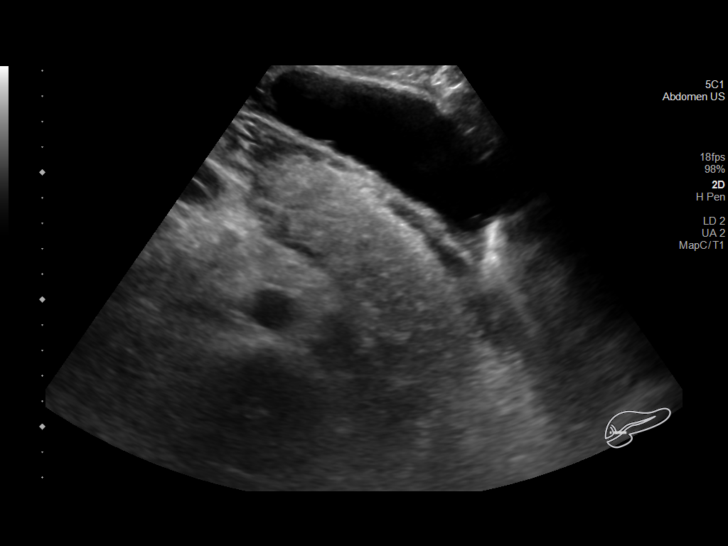
[im 99/149]
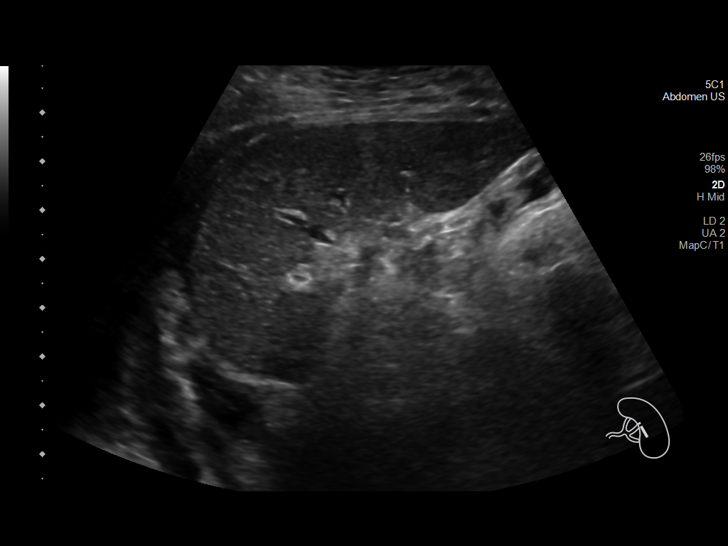
[im 112/149]
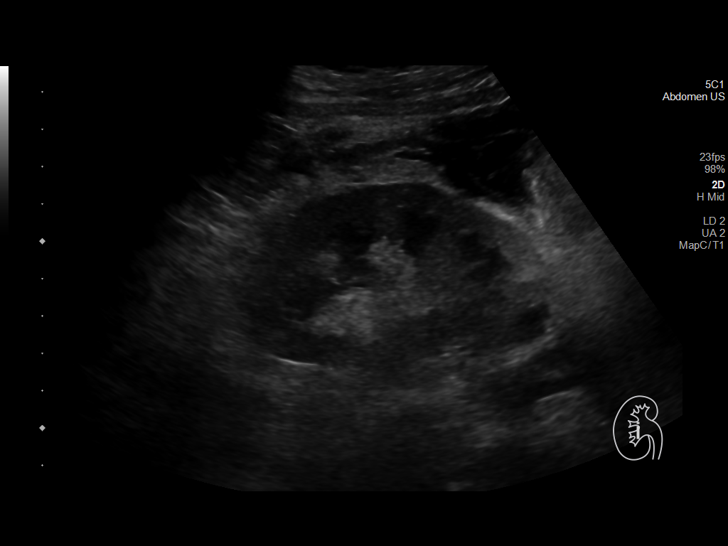
[im 124/149]
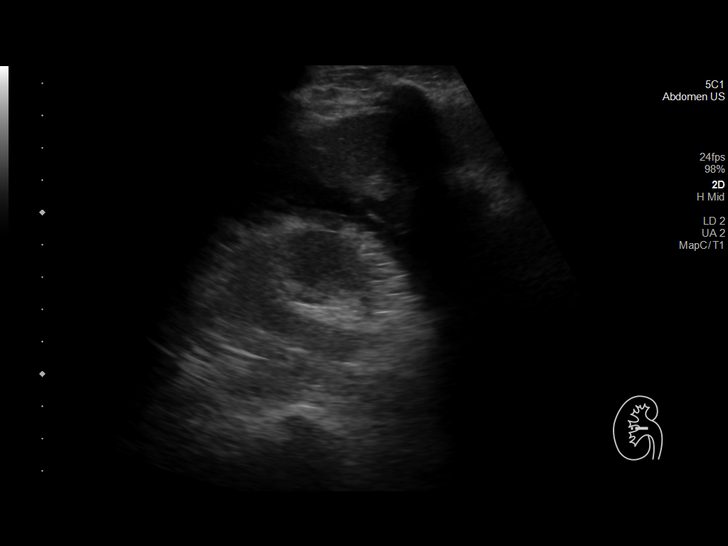
[im 136/149]
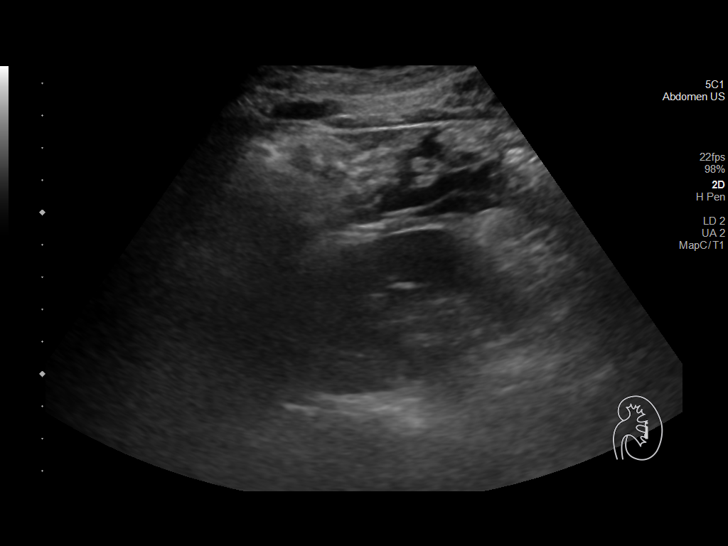
[im 149/149]
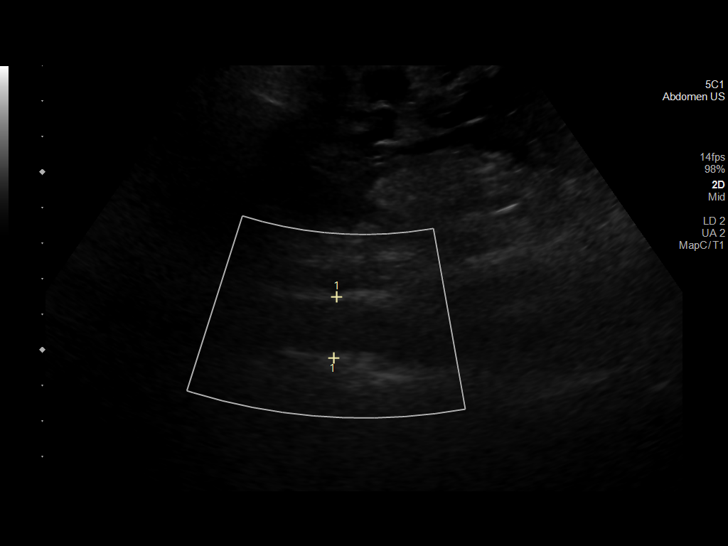

[13 of 25 positions shown; findings below may reference images not displayed]

FINDINGS: Gallbladder: No gallstones or wall thickening visualized. No
sonographic Murphy sign noted by sonographer.

Common bile duct: Diameter: 5 mm, within normal limits.

Liver: Diffusely increased echogenicity of the hepatic parenchyma,
consistent with hepatic steatosis. No hepatic mass identified.
Portal vein is patent on color Doppler imaging with normal direction
of blood flow towards the liver.

IVC: No abnormality visualized.

Pancreas: Visualized portion of the pancreatic head and body appears
prominent with mild peripancreatic fluid, highly suspicious for
acute pancreatitis.

Spleen: Size and appearance within normal limits.

Right Kidney: Length: 8.8 cm. Echogenicity within normal limits. No
mass or hydronephrosis visualized.

Left Kidney: Length: 8.4 cm. Limited visualization due to patient
habitus and immobility. Echogenicity within normal limits. No mass
or hydronephrosis visualized.

Abdominal aorta: No aneurysm visualized.

Other findings: Small amount of free fluid seen in Morison's pouch
and the left upper quadrant.
IMPRESSION: Probable acute pancreatitis, with small amount of peripancreatic and
bilateral upper quadrant fluid.

No evidence of gallstones or biliary ductal dilatation.

Hepatic steatosis.

## 2022-04-30 IMAGING — DX DG CHEST 1V PORT
1 series · 1 of 1 positions shown · non-contrast
Comparison: 10/25/2021

CLINICAL DATA: Dyspnea

EXAM:
PORTABLE CHEST 1 VIEW

[chest]
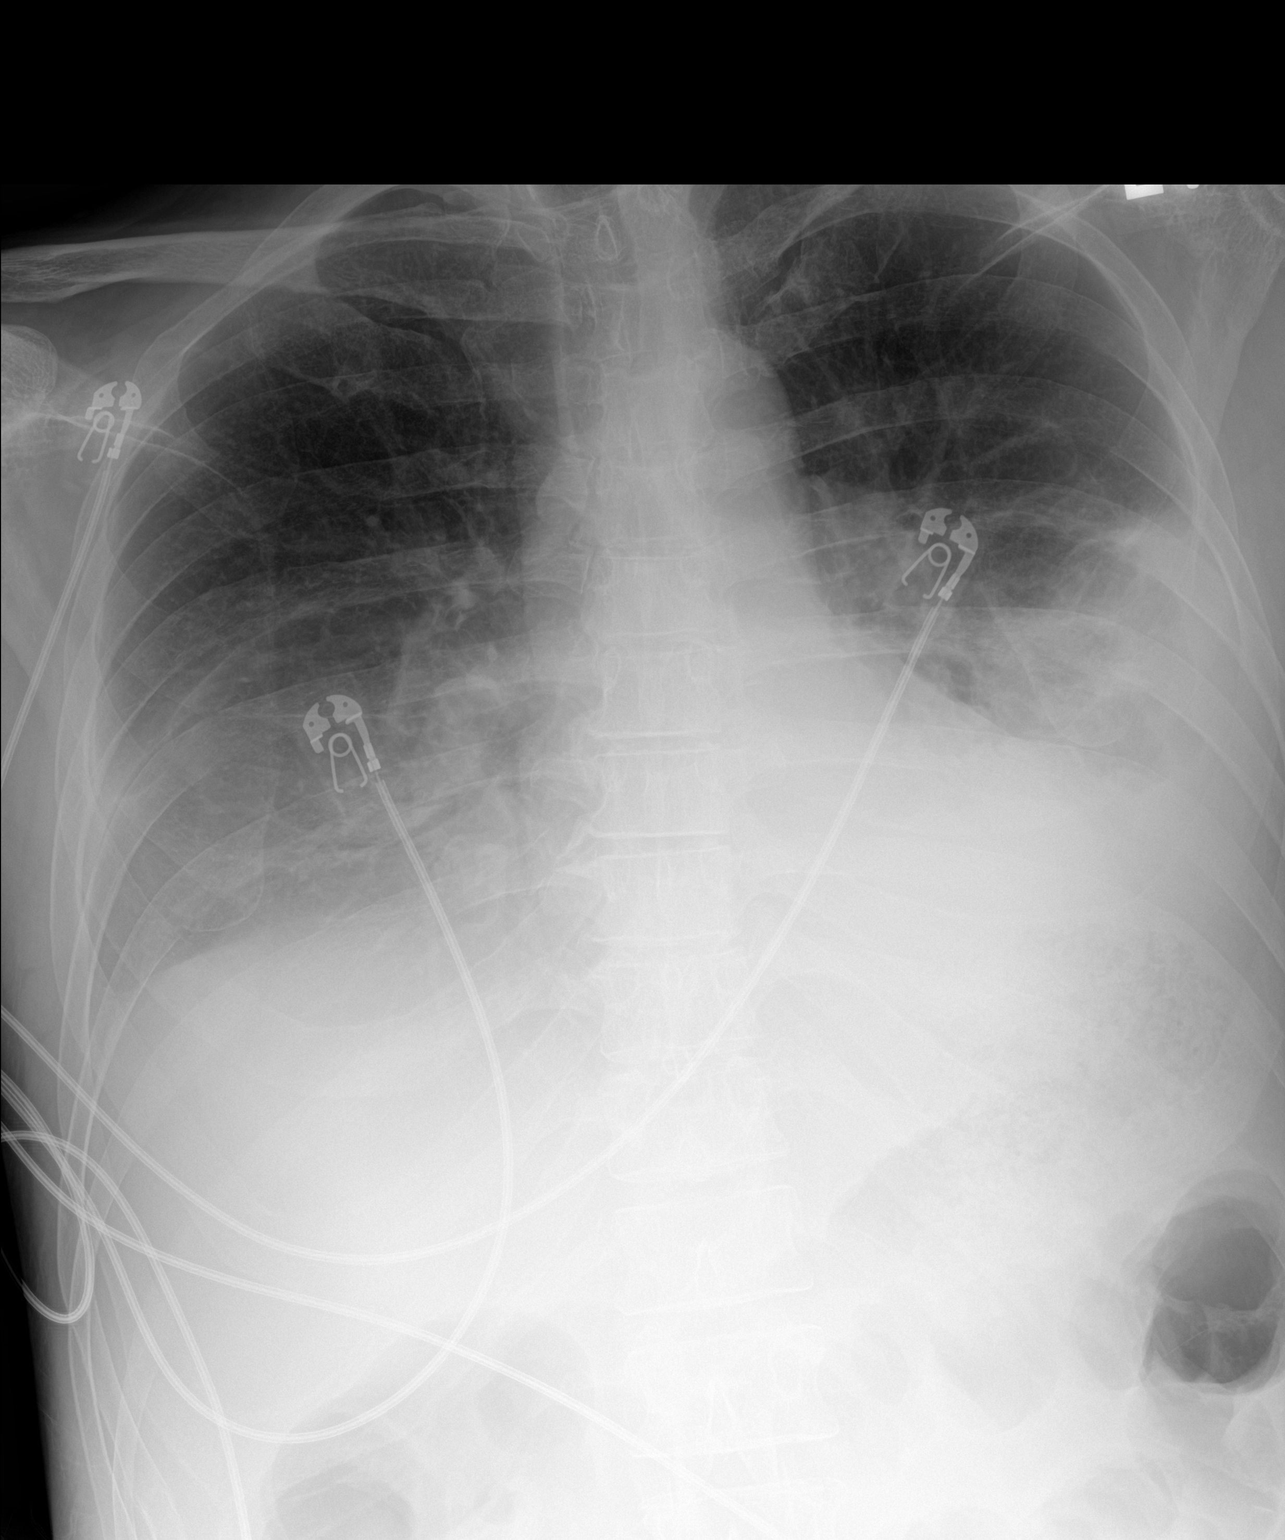

[1 of 1 positions shown; findings below may reference images not displayed]

FINDINGS: Transverse diameter of heart is increased. There is interval
appearance of moderate bilateral pleural effusions, more so on the
left side. Possibility of underlying infiltrates in the lower lung
fields is not excluded. There is no pneumothorax.
IMPRESSION: There is interval appearance of moderate bilateral pleural
effusions, more so on the left side. Possibility of underlying
atelectasis/pneumonia is not excluded.

## 2023-06-06 ENCOUNTER — Encounter (HOSPITAL_COMMUNITY): Payer: Self-pay

## 2023-06-06 ENCOUNTER — Emergency Department (HOSPITAL_COMMUNITY): Payer: Self-pay

## 2023-06-06 ENCOUNTER — Emergency Department (HOSPITAL_COMMUNITY)
Admission: EM | Admit: 2023-06-06 | Discharge: 2023-06-06 | Disposition: A | Discharge status: Home / Self Care | Payer: Self-pay | Attending: Emergency Medicine | Admitting: Emergency Medicine

## 2023-06-06 ENCOUNTER — Other Ambulatory Visit: Payer: Self-pay

## 2023-06-06 DIAGNOSIS — K859 Acute pancreatitis without necrosis or infection, unspecified: Secondary | ICD-10-CM | POA: Insufficient documentation

## 2023-06-06 LAB — CBC WITH DIFFERENTIAL/PLATELET
Abs Immature Granulocytes: 0.02 10*3/uL (ref 0.00–0.07)
Basophils Absolute: 0 10*3/uL (ref 0.0–0.1)
Basophils Relative: 0 %
Eosinophils Absolute: 0.1 10*3/uL (ref 0.0–0.5)
Eosinophils Relative: 1 %
HCT: 49.2 % (ref 39.0–52.0)
Hemoglobin: 17.4 g/dL — ABNORMAL HIGH (ref 13.0–17.0)
Immature Granulocytes: 0 %
Lymphocytes Relative: 14 %
Lymphs Abs: 0.8 10*3/uL (ref 0.7–4.0)
MCH: 36 pg — ABNORMAL HIGH (ref 26.0–34.0)
MCHC: 35.4 g/dL (ref 30.0–36.0)
MCV: 101.7 fL — ABNORMAL HIGH (ref 80.0–100.0)
Monocytes Absolute: 0.4 10*3/uL (ref 0.1–1.0)
Monocytes Relative: 7 %
Neutro Abs: 4.1 10*3/uL (ref 1.7–7.7)
Neutrophils Relative %: 78 %
Platelets: 141 10*3/uL — ABNORMAL LOW (ref 150–400)
RBC: 4.84 MIL/uL (ref 4.22–5.81)
RDW: 12.2 % (ref 11.5–15.5)
WBC: 5.3 10*3/uL (ref 4.0–10.5)
nRBC: 0 % (ref 0.0–0.2)

## 2023-06-06 LAB — COMPREHENSIVE METABOLIC PANEL
ALT: 17 U/L (ref 0–44)
AST: 25 U/L (ref 15–41)
Albumin: 3.9 g/dL (ref 3.5–5.0)
Alkaline Phosphatase: 80 U/L (ref 38–126)
Anion gap: 11 (ref 5–15)
BUN: 8 mg/dL (ref 6–20)
CO2: 25 mmol/L (ref 22–32)
Calcium: 9.3 mg/dL (ref 8.9–10.3)
Chloride: 102 mmol/L (ref 98–111)
Creatinine, Ser: 1.19 mg/dL (ref 0.61–1.24)
GFR, Estimated: >60 mL/min (ref >60)
Glucose, Bld: 121 mg/dL — ABNORMAL HIGH (ref 70–99)
Potassium: 4.4 mmol/L (ref 3.5–5.1)
Sodium: 138 mmol/L (ref 135–145)
Total Bilirubin: 0.6 mg/dL (ref 0.3–1.2)
Total Protein: 7.1 g/dL (ref 6.5–8.1)

## 2023-06-06 LAB — LIPASE, BLOOD: Lipase: 184 U/L — ABNORMAL HIGH (ref 11–51)

## 2023-06-06 LAB — ETHANOL: Alcohol, Ethyl (B): <10 mg/dL (ref <10)

## 2023-06-06 MED ORDER — KETOROLAC TROMETHAMINE 15 MG/ML IJ SOLN
15.0000 mg | Freq: Once | INTRAMUSCULAR | Status: AC
  Administered 2023-06-06: 15 mg via INTRAVENOUS
  Filled 2023-06-06: qty 1

## 2023-06-06 MED ORDER — ONDANSETRON 4 MG PO TBDP: 4.0000 mg | ORAL_TABLET | Freq: Three times a day (TID) | ORAL | 0 refills | Status: AC | PRN

## 2023-06-06 MED ORDER — ONDANSETRON 4 MG PO TBDP: 4.0000 mg | ORAL_TABLET | Freq: Once | ORAL | Status: DC

## 2023-06-06 MED ORDER — ONDANSETRON HCL 4 MG/2ML IJ SOLN
4.0000 mg | Freq: Once | INTRAMUSCULAR | Status: AC
  Administered 2023-06-06: 4 mg via INTRAVENOUS
  Filled 2023-06-06: qty 2

## 2023-06-06 MED ORDER — OXYCODONE-ACETAMINOPHEN 5-325 MG PO TABS
1.0000 | ORAL_TABLET | Freq: Once | ORAL | Status: AC | PRN
  Administered 2023-06-06: 1 via ORAL
  Filled 2023-06-06: qty 1

## 2023-06-06 MED ORDER — OXYCODONE-ACETAMINOPHEN 5-325 MG PO TABS: 1.0000 | ORAL_TABLET | Freq: Four times a day (QID) | ORAL | 0 refills | Status: AC | PRN

## 2023-06-06 MED ORDER — FENTANYL CITRATE PF 50 MCG/ML IJ SOSY
50.0000 ug | PREFILLED_SYRINGE | Freq: Once | INTRAMUSCULAR | Status: AC
  Administered 2023-06-06: 50 ug via INTRAVENOUS
  Filled 2023-06-06: qty 1

## 2023-06-06 MED ORDER — LACTATED RINGERS IV BOLUS
1000.0000 mL | Freq: Once | INTRAVENOUS | Status: AC
Start: 2023-06-06 — End: 2023-06-06
  Administered 2023-06-06: 1000 mL via INTRAVENOUS

## 2023-06-06 MED ORDER — IOHEXOL 350 MG/ML SOLN
75.0000 mL | Freq: Once | INTRAVENOUS | Status: AC | PRN
  Administered 2023-06-06: 75 mL via INTRAVENOUS

## 2023-06-06 NOTE — ED Triage Notes (Signed)
N/v/d abd pain x 1 days hx of pancreatitis.  Last drink was 2 days ago

## 2023-06-06 NOTE — Discharge Instructions (Addendum)
Today was found that you have pancreatitis, please follow-up with your primary care doctor, if you do not have a primary care doctor, use the number below, to call and make an appointment with the primary care doctor.  I have sent you some nausea medicine and pain medicine, to help with your pain.  Please take this only when you have severe pain.  Do not give this medicine to anyone else.  It may make you little bit drowsy.  If you have intractable nausea, vomiting, worsening abdominal plain please return to the ER

## 2023-06-06 NOTE — ED Provider Triage Note (Signed)
Emergency Medicine Provider Triage Evaluation Note  Stephen Bender , a 47 y.o. male  was evaluated in triage.  Pt complains of vomiting and epigastric painx1 day. Denies fevers, chills, diarrhea. HX of pancreatitis, feels similar. No chest pain or SOB.   Review of Systems  Positive: N, abd pain Negative: diarrhea  Physical Exam  There were no vitals taken for this visit. Gen:   Awake, no distress   Resp:  Normal effort  MSK:   Moves extremities without difficulty  Other:  +epigastric pain  Medical Decision Making  Medically screening exam initiated at 4:04 PM.  Appropriate orders placed.  Stephen Bender was informed that the remainder of the evaluation will be completed by another provider, this initial triage assessment does not replace that evaluation, and the importance of remaining in the ED until their evaluation is complete.     Pete Pelt, [place] 06/06/23 1605

## 2023-06-06 NOTE — ED Provider Notes (Signed)
Patient's care assumed at 9 PM.  Patient is awaiting results of CT scan.  Patient has pancreatitis. CT scan shows pancreatitis.  This has been seen on 2 previous scans.  No acute change.  Patient is discharged with follow-up instructions and medication for pain.   Stephen Bender 06/06/23 2135    Gwyneth Sprout, MD 06/06/23 2322

## 2023-06-06 NOTE — ED Provider Notes (Signed)
[place] [facility] AT [facility] Provider Note   CSN: [id number] Arrival date & time: 06/06/23  1559     History  Chief Complaint  Patient presents with   Abdominal Pain   Back Pain    Stephen Bender is a 47 y.o. male, history of pancreatitis, alcoholism, who presents to the ED secondary to epigastric pain, radiating to his back, has been going on for the last day.  Has associated nausea, vomit, vomiting.  Has not had any bloody emesis.  States the pain is severe 10 out of 10.  Denies any shortness of breath, chest pain.     Home Medications Prior to Admission medications   Medication Sig Start Date End Date Taking? Authorizing Provider  ondansetron (ZOFRAN-ODT) 4 MG disintegrating tablet Take 1 tablet (4 mg total) by mouth every 8 (eight) hours as needed. 06/06/23  Yes Scotland Korver L, PA  oxyCODONE-acetaminophen (PERCOCET/ROXICET) 5-325 MG tablet Take 1 tablet by mouth every 6 (six) hours as needed for severe pain (pain score 7-10). 06/06/23  Yes Bruna Dills L, PA  chlordiazePOXIDE (LIBRIUM) 10 MG capsule Take 1 capsule (10 mg total) by mouth 3 (three) times daily as needed for anxiety or withdrawal. 10/29/21   Leatha Gilding, MD  fenofibrate (TRICOR) 145 MG tablet Take 1 tablet (145 mg total) by mouth daily. 10/29/21   Leatha Gilding, MD  Multiple Vitamin (MULTIVITAMIN WITH MINERALS) TABS tablet Take 1 tablet by mouth daily. 10/30/21   Leatha Gilding, MD  pantoprazole (PROTONIX) 40 MG tablet Take 1 tablet (40 mg total) by mouth daily. 10/29/21 11/28/21  Leatha Gilding, MD  Tetrahydrozoline HCl (VISINE OP) Place 1 drop into both eyes daily as needed (dry eyes).    [provider]      Allergies    Patient has no known allergies.    Review of Systems   Review of Systems  Constitutional:  Negative for fever.  Gastrointestinal:  Positive for abdominal pain.  Musculoskeletal:  Positive for back pain.    Physical Exam Updated Vital  Signs BP (!) 150/99   Pulse 71   Temp 98.5 F (36.9 C)   Resp 18   Ht 6\' 4"  (1.93 m)   Wt 72.6 kg   SpO2 100%   BMI 19.48 kg/m  Physical Exam Vitals and nursing note reviewed.  Constitutional:      General: He is not in acute distress.    Appearance: He is well-developed.  HENT:     Head: Normocephalic and atraumatic.  Eyes:     Conjunctiva/sclera: Conjunctivae normal.  Cardiovascular:     Rate and Rhythm: Normal rate and regular rhythm.     Heart sounds: No murmur heard. Pulmonary:     Effort: Pulmonary effort is normal. No respiratory distress.     Breath sounds: Normal breath sounds.  Abdominal:     Palpations: Abdomen is soft.     Tenderness: There is abdominal tenderness in the epigastric area.  Musculoskeletal:        General: No swelling.     Cervical back: Neck supple.  Skin:    General: Skin is warm and dry.     Capillary Refill: Capillary refill takes less than 2 seconds.  Neurological:     Mental Status: He is alert.  Psychiatric:        Mood and Affect: Mood normal.     ED Results / Procedures / Treatments   Labs (all labs ordered are listed,  but only abnormal results are displayed) Labs Reviewed  CBC WITH DIFFERENTIAL/PLATELET - Abnormal; Notable for the following components:      Result Value   Hemoglobin 17.4 (*)    MCV 101.7 (*)    MCH 36.0 (*)    Platelets 141 (*)    All other components within normal limits  COMPREHENSIVE METABOLIC PANEL - Abnormal; Notable for the following components:   Glucose, Bld 121 (*)    All other components within normal limits  LIPASE, BLOOD - Abnormal; Notable for the following components:   Lipase 184 (*)    All other components within normal limits  ETHANOL    EKG None  Radiology No results found.  Procedures Procedures    Medications Ordered in ED Medications  oxyCODONE-acetaminophen (PERCOCET/ROXICET) 5-325 MG per tablet 1 tablet (has no administration in time range)  ketorolac (TORADOL) 15  MG/ML injection 15 mg (15 mg Intravenous Given 06/06/23 1853)  lactated ringers bolus 1,000 mL (0 mLs Intravenous Stopped 06/06/23 2035)  ondansetron (ZOFRAN) injection 4 mg (4 mg Intravenous Given 06/06/23 1852)  fentaNYL (SUBLIMAZE) injection 50 mcg (50 mcg Intravenous Given 06/06/23 1944)  iohexol (OMNIPAQUE) 350 MG/ML injection 75 mL (75 mLs Intravenous Contrast Given 06/06/23 2022)    ED Course/ Medical Decision Making/ A&P                                 Medical Decision Making Patient is a 47 year old male, here for epigastric pain, going to his back, it has been going on for the last day.  Has associated nausea, and vomit.  No bloody emesis.  Pain is a severe 10 out of 10.  Denies any shortness of breath, chest pain, diarrhea.  Overall well-appearing.  Epigastric tenderness to palpation without guarding.  Will obtain a lipase, CBC, CMP, and a CT abdomen pelvis, for further evaluation.  He states he has not drink this week  Amount and/or Complexity of Data Reviewed Labs: ordered.    Details: Lipase of 184, no transaminitis Radiology: ordered. Discussion of management or test interpretation with external provider(s): Patient has a pancreatitis, able to tolerate p.o. intake, CT abdomen pelvis pending at this time, discussed with Roanna Raider, she will follow-up on CT abdomen pelvis results.  Patient states he would like to go home, and requested a pain pill prior to leaving, I discussed that we need to wait on the CT scan, and send pain pills to the pharmacy for him, for his plan to discharge.  Clydie Braun aware of this.    Risk Prescription drug management.    Final Clinical Impression(s) / ED Diagnoses Final diagnoses:  Acute pancreatitis, unspecified complication status, unspecified pancreatitis type    Rx / DC Orders ED Discharge Orders          Ordered    oxyCODONE-acetaminophen (PERCOCET/ROXICET) 5-325 MG tablet  Every 6 hours PRN        06/06/23 2037    ondansetron  (ZOFRAN-ODT) 4 MG disintegrating tablet  Every 8 hours PRN        06/06/23 2037              Sricharan Lacomb, [place]ley [place], [place] 06/06/23 2107    Derwood Kaplan, MD 06/11/23 1947
